# Patient Record
Sex: Female | Born: 1970 | Race: White | Hispanic: No | Marital: Married | Smoking: Former smoker
Health system: Southern US, Community
[De-identification: ages and names within clinical notes are randomized; demographics above are authoritative.]

## PROBLEM LIST (undated history)

## (undated) DIAGNOSIS — H04123 Dry eye syndrome of bilateral lacrimal glands: Secondary | ICD-10-CM

## (undated) DIAGNOSIS — M419 Scoliosis, unspecified: Secondary | ICD-10-CM

## (undated) DIAGNOSIS — L309 Dermatitis, unspecified: Secondary | ICD-10-CM

## (undated) DIAGNOSIS — A630 Anogenital (venereal) warts: Secondary | ICD-10-CM

## (undated) DIAGNOSIS — I7300 Raynaud's syndrome without gangrene: Secondary | ICD-10-CM

## (undated) DIAGNOSIS — B009 Herpesviral infection, unspecified: Secondary | ICD-10-CM

## (undated) DIAGNOSIS — R011 Cardiac murmur, unspecified: Secondary | ICD-10-CM

## (undated) HISTORY — DX: Dry eye syndrome of bilateral lacrimal glands: H04.123

## (undated) HISTORY — DX: Herpesviral infection, unspecified: B00.9

## (undated) HISTORY — DX: Raynaud's syndrome without gangrene: I73.00

## (undated) HISTORY — DX: Cardiac murmur, unspecified: R01.1

## (undated) HISTORY — DX: Dermatitis, unspecified: L30.9

## (undated) HISTORY — PX: WISDOM TOOTH EXTRACTION: SHX21

## (undated) HISTORY — DX: Anogenital (venereal) warts: A63.0

## (undated) HISTORY — PX: TONSILLECTOMY: SUR1361

## (undated) HISTORY — DX: Scoliosis, unspecified: M41.9

---

## 2015-10-11 LAB — LIPID PANEL
Cholesterol: 183 (ref 0–200)
HDL: 67 (ref 35–70)
LDL Cholesterol: 104
Triglycerides: 61 (ref 40–160)

## 2015-10-11 LAB — BASIC METABOLIC PANEL: GLUCOSE: 92

## 2017-12-03 LAB — HM MAMMOGRAPHY

## 2018-09-07 ENCOUNTER — Ambulatory Visit (INDEPENDENT_AMBULATORY_CARE_PROVIDER_SITE_OTHER): Payer: 59 | Admitting: Family Medicine

## 2018-09-07 ENCOUNTER — Encounter: Payer: Self-pay | Admitting: Family Medicine

## 2018-09-07 VITALS — BP 100/62 | HR 74 | Temp 99.0°F | Ht 64.75 in | Wt 135.2 lb

## 2018-09-07 DIAGNOSIS — Z3009 Encounter for other general counseling and advice on contraception: Secondary | ICD-10-CM

## 2018-09-07 DIAGNOSIS — J309 Allergic rhinitis, unspecified: Secondary | ICD-10-CM | POA: Insufficient documentation

## 2018-09-07 DIAGNOSIS — Z309 Encounter for contraceptive management, unspecified: Secondary | ICD-10-CM | POA: Insufficient documentation

## 2018-09-07 DIAGNOSIS — M419 Scoliosis, unspecified: Secondary | ICD-10-CM | POA: Diagnosis not present

## 2018-09-07 DIAGNOSIS — N649 Disorder of breast, unspecified: Secondary | ICD-10-CM

## 2018-09-07 DIAGNOSIS — H04129 Dry eye syndrome of unspecified lacrimal gland: Secondary | ICD-10-CM | POA: Insufficient documentation

## 2018-09-07 DIAGNOSIS — H04123 Dry eye syndrome of bilateral lacrimal glands: Secondary | ICD-10-CM

## 2018-09-07 MED ORDER — LOW-OGESTREL 0.3-30 MG-MCG PO TABS: ORAL_TABLET | ORAL | 11 refills | Status: DC

## 2018-09-07 NOTE — Assessment & Plan Note (Signed)
Stable.  Continue over-the-counter Allegra.

## 2018-09-07 NOTE — Progress Notes (Signed)
Chief Complaint:  Sarah Murphy is a 48 y.o. female who presents today with a chief complaint of nipple abnormality and to establish care.   Assessment/Plan:  Nipple abnormality Reassuring exam.  She has a few prominent hair follicles without any signs of underlying malignancy.  She is due for her mammogram-handout was given with instruction on how to get this set up.  Encounter for other general counseling and advice on contraception Will refill her OCPs today.  Discussed potential increased risk with advancing age and OCPs including, but not limited to dyslipidemia, vascular disease, VTE, and certain forms of cancer.  Chronically dry eyes Stable.  Continue management per ophthalmology.  Scoliosis Stable.  Continue home exercise program.  Allergic rhinitis Stable.  Continue over-the-counter Allegra.  Preventative Healthcare Patient was instructed to return soon for CPE. Health Maintenance Due  Topic Date Due  . HIV Screening  07/04/1985  . PAP SMEAR-Modifier  07/05/1991  . INFLUENZA VACCINE  03/11/2018      Subjective:  HPI:  Nipple abnormality Her symptoms brought her husband yesterday.  Located on the left inner elbow.  Has a few fine bumps to the area.  No pain.  No previous issues.  Has had recent mammogram which was negative.  No treatments tried.  No obvious precipitating events.  She has several other chronic, stable medical conditions outlined below:  # Allergic Rhinitis - Currently on Allegra daily and tolerates well.  # On Contraception -Currently on OCP once daily and tolerating well.  % Scoliosis -Currently managing without medications via chiropractor and home exercises.  % Chronic Dry Eyes -Follows with ophthalmology. -Currently on Restasis drops.  ROS: Per HPI, otherwise a complete review of systems was negative.   PMH:  The following were reviewed and entered/updated in epic: Past Medical History:  Diagnosis Date  . Genital warts   . Heart  murmur   . Scoliosis    Patient Active Problem List   Diagnosis Date Noted  . Scoliosis 09/07/2018  . Allergic rhinitis 09/07/2018  . Encounter for other general counseling and advice on contraception 09/07/2018  . Chronically dry eyes 09/07/2018   Past Surgical History:  Procedure Laterality Date  . TONSILLECTOMY      Family History  Problem Relation Age of Onset  . Asthma Mother   . Arthritis Mother   . Celiac disease Mother   . Arthritis Father   . Heart disease Father   . Atrial fibrillation Father   . Arthritis Sister   . Learning disabilities Sister   . Celiac disease Sister   . Asthma Maternal Grandmother   . Miscarriages / Stillbirths Maternal Grandmother   . Stroke Maternal Grandmother   . Colon cancer Paternal Grandmother   . Cancer Paternal Grandfather   . Leukemia Paternal Aunt   . Breast cancer Neg Hx     Medications- reviewed and updated Current Outpatient Medications  Medication Sig Dispense Refill  . BIOTIN PO Take by mouth.    . LOW-OGESTREL 0.3-30 MG-MCG tablet Take 1 tablet by mouth daily. 1 Package 11  . MAGNESIUM PO Take by mouth daily.     No current facility-administered medications for this visit.     Allergies-reviewed and updated No Known Allergies  Social History   Socioeconomic History  . Marital status: Married    Spouse name: Not on file  . Number of children: Not on file  . Years of education: Not on file  . Highest education level: Not on file  Occupational  History  . Not on file  Social Needs  . Financial resource strain: Not on file  . Food insecurity:    Worry: Not on file    Inability: Not on file  . Transportation needs:    Medical: Not on file    Non-medical: Not on file  Tobacco Use  . Smoking status: Former Smoker    Last attempt to quit: 02/05/2004    Years since quitting: 14.5  . Smokeless tobacco: Never Used  Substance and Sexual Activity  . Alcohol use: Yes  . Drug use: Never  . Sexual activity: Yes    Lifestyle  . Physical activity:    Days per week: Not on file    Minutes per session: Not on file  . Stress: Not on file  Relationships  . Social connections:    Talks on phone: Not on file    Gets together: Not on file    Attends religious service: Not on file    Active member of club or organization: Not on file    Attends meetings of clubs or organizations: Not on file    Relationship status: Not on file  Other Topics Concern  . Not on file  Social History Narrative  . Not on file         Objective:  Physical Exam: BP 100/62 (BP Location: Left Arm, Patient Position: Sitting, Cuff Size: Normal)   Pulse 74   Temp 99 F (37.2 C) (Oral)   Ht 5' 4.75" (1.645 m)   Wt 135 lb 3.2 oz (61.3 kg)   LMP 08/24/2018   SpO2 99%   BMI 22.67 kg/m   Gen: NAD, resting comfortably CV: Regular rate and rhythm with no murmurs appreciated Pulm: Normal work of breathing, clear to auscultation bilaterally with no crackles, wheezes, or rhonchi Breast: Left nipple with a few prominent hair follicles, no masses, lesions, or discharge.  Chaperone present for exam. GI: Normal bowel sounds present. Soft, Nontender, Nondistended. MSK: No edema, cyanosis, or clubbing noted Skin: Warm, dry Neuro: Grossly normal, moves all extremities Psych: Normal affect and thought content     Taunia Frasco M. Jerline Pain, MD 09/07/2018 12:20 PM

## 2018-09-07 NOTE — Assessment & Plan Note (Signed)
Stable.  Continue management per ophthalmology. 

## 2018-09-07 NOTE — Patient Instructions (Signed)
It was very nice to see you today!  I will refill your birth control medication.  The spot on your breast is nothing to worry about.  I will see you back in a few months for your physical. We will also update your mammogram.  Come back to see me sooner if needed.   Take care, Dr Jerline Pain

## 2018-09-07 NOTE — Assessment & Plan Note (Signed)
Stable.  Continue home exercise program.

## 2018-09-07 NOTE — Assessment & Plan Note (Signed)
Will refill her OCPs today.  Discussed potential increased risk with advancing age and OCPs including, but not limited to dyslipidemia, vascular disease, VTE, and certain forms of cancer.

## 2018-09-16 ENCOUNTER — Encounter: Payer: Self-pay | Admitting: Family Medicine

## 2018-09-27 ENCOUNTER — Other Ambulatory Visit: Payer: Self-pay

## 2018-09-27 ENCOUNTER — Telehealth: Payer: Self-pay | Admitting: Family Medicine

## 2018-09-27 MED ORDER — LOW-OGESTREL 0.3-30 MG-MCG PO TABS: ORAL_TABLET | ORAL | 3 refills | Status: DC

## 2018-09-27 NOTE — Telephone Encounter (Signed)
Copied from [place] [id number]. Topic: Quick Communication - Rx Refill/Question >> Sep 27, 2018  8:12 AM Margot Ables wrote: Medication: LOW-OGESTREL 0.3-30 MG-MCG tablet - mail order pharmacy noted this is a 28 day supply and needs to be changed to 90 day supply for them to fill RX  Has the patient contacted their pharmacy? yes Preferred Pharmacy (with phone number or street name): [place], [place] [phone] (Phone) [phone] (Fax)

## 2018-09-27 NOTE — Telephone Encounter (Signed)
See note

## 2018-09-27 NOTE — Telephone Encounter (Signed)
Rx sent to pharmacy   

## 2018-11-02 ENCOUNTER — Encounter: Payer: Self-pay | Admitting: Family Medicine

## 2018-11-02 ENCOUNTER — Ambulatory Visit (INDEPENDENT_AMBULATORY_CARE_PROVIDER_SITE_OTHER): Payer: 59 | Admitting: Family Medicine

## 2018-11-02 ENCOUNTER — Other Ambulatory Visit: Payer: Self-pay

## 2018-11-02 VITALS — BP 118/64 | HR 73 | Temp 98.4°F | Ht 65.0 in | Wt 138.4 lb

## 2018-11-02 DIAGNOSIS — Z131 Encounter for screening for diabetes mellitus: Secondary | ICD-10-CM

## 2018-11-02 DIAGNOSIS — Z13220 Encounter for screening for lipoid disorders: Secondary | ICD-10-CM

## 2018-11-02 DIAGNOSIS — H04123 Dry eye syndrome of bilateral lacrimal glands: Secondary | ICD-10-CM

## 2018-11-02 DIAGNOSIS — J309 Allergic rhinitis, unspecified: Secondary | ICD-10-CM | POA: Diagnosis not present

## 2018-11-02 DIAGNOSIS — Z0001 Encounter for general adult medical examination with abnormal findings: Secondary | ICD-10-CM

## 2018-11-02 LAB — LIPID PANEL
Cholesterol: 169 mg/dL (ref 0–200)
HDL: 55.7 mg/dL (ref >39.00)
LDL Cholesterol: 96 mg/dL (ref 0–99)
NonHDL: 113.54
Total CHOL/HDL Ratio: 3
Triglycerides: 86 mg/dL (ref 0.0–149.0)
VLDL: 17.2 mg/dL (ref 0.0–40.0)

## 2018-11-02 LAB — GLUCOSE, RANDOM: Glucose, Bld: 89 mg/dL (ref 70–99)

## 2018-11-02 NOTE — Progress Notes (Signed)
Dr Marigene Ehlers interpretation of your lab work:  [facility]! Your cholesterol levels and blood sugar levels are perfect. Keep up the good work and we can recheck in a year or so.    If you have any additional questions, please give [place] a call or send [place] a message through [place].  Take care, Dr Jerline Pain

## 2018-11-02 NOTE — Assessment & Plan Note (Signed)
Stable.  Continue over-the-counter Allegra.

## 2018-11-02 NOTE — Patient Instructions (Signed)
It was very nice to see you today!  Keep up the good work!  We will check labs today.   Eat at least 3 REAL meals and 1-2 snacks per day.  Aim for no more than 5 hours between eating.  Eat breakfast within one hour of getting up.    Obtain twice as many fruits/vegetables as protein or carbohydrate foods for both lunch and dinner.   Cut down on sweet beverages. This includes juice, soda, and sweet tea.    Exercise at least 150 minutes every week.   Come back in 1 year for your next physical with blood work, or sooner as needed.  Take care, Dr Jerline Pain   Preventive Care 40-64 Years, Female Preventive care refers to lifestyle choices and visits with your health care provider that can promote health and wellness. What does preventive care include?   A yearly physical exam. This is also called an annual well check.  Dental exams once or twice a year.  Routine eye exams. Ask your health care provider how often you should have your eyes checked.  Personal lifestyle choices, including: ? Daily care of your teeth and gums. ? Regular physical activity. ? Eating a healthy diet. ? Avoiding tobacco and drug use. ? Limiting alcohol use. ? Practicing safe sex. ? Taking low-dose aspirin daily starting at age 19. ? Taking vitamin and mineral supplements as recommended by your health care provider. What happens during an annual well check? The services and screenings done by your health care provider during your annual well check will depend on your age, overall health, lifestyle risk factors, and family history of disease. Counseling Your health care provider may ask you questions about your:  Alcohol use.  Tobacco use.  Drug use.  Emotional well-being.  Home and relationship well-being.  Sexual activity.  Eating habits.  Work and work Statistician.  Method of birth control.  Menstrual cycle.  Pregnancy history. Screening You may have the following tests or  measurements:  Height, weight, and BMI.  Blood pressure.  Lipid and cholesterol levels. These may be checked every 5 years, or more frequently if you are over 69 years old.  Skin check.  Lung cancer screening. You may have this screening every year starting at age 33 if you have a 30-pack-year history of smoking and currently smoke or have quit within the past 15 years.  Colorectal cancer screening. All adults should have this screening starting at age 32 and continuing until age 7. Your health care provider may recommend screening at age 75. You will have tests every 1-10 years, depending on your results and the type of screening test. People at increased risk should start screening at an earlier age. Screening tests may include: ? Guaiac-based fecal occult blood testing. ? Fecal immunochemical test (FIT). ? Stool DNA test. ? Virtual colonoscopy. ? Sigmoidoscopy. During this test, a flexible tube with a tiny camera (sigmoidoscope) is used to examine your rectum and lower colon. The sigmoidoscope is inserted through your anus into your rectum and lower colon. ? Colonoscopy. During this test, a long, thin, flexible tube with a tiny camera (colonoscope) is used to examine your entire colon and rectum.  Hepatitis C blood test.  Hepatitis B blood test.  Sexually transmitted disease (STD) testing.  Diabetes screening. This is done by checking your blood sugar (glucose) after you have not eaten for a while (fasting). You may have this done every 1-3 years.  Mammogram. This may be done every 1-2  years. Talk to your health care provider about when you should start having regular mammograms. This may depend on whether you have a family history of breast cancer.  BRCA-related cancer screening. This may be done if you have a family history of breast, ovarian, tubal, or peritoneal cancers.  Pelvic exam and Pap test. This may be done every 3 years starting at age 16. Starting at age 59, this may  be done every 5 years if you have a Pap test in combination with an HPV test.  Bone density scan. This is done to screen for osteoporosis. You may have this scan if you are at high risk for osteoporosis. Discuss your test results, treatment options, and if necessary, the need for more tests with your health care provider. Vaccines Your health care provider may recommend certain vaccines, such as:  Influenza vaccine. This is recommended every year.  Tetanus, diphtheria, and acellular pertussis (Tdap, Td) vaccine. You may need a Td booster every 10 years.  Varicella vaccine. You may need this if you have not been vaccinated.  Zoster vaccine. You may need this after age 27.  Measles, mumps, and rubella (MMR) vaccine. You may need at least one dose of MMR if you were born in 1957 or later. You may also need a second dose.  Pneumococcal 13-valent conjugate (PCV13) vaccine. You may need this if you have certain conditions and were not previously vaccinated.  Pneumococcal polysaccharide (PPSV23) vaccine. You may need one or two doses if you smoke cigarettes or if you have certain conditions.  Meningococcal vaccine. You may need this if you have certain conditions.  Hepatitis A vaccine. You may need this if you have certain conditions or if you travel or work in places where you may be exposed to hepatitis A.  Hepatitis B vaccine. You may need this if you have certain conditions or if you travel or work in places where you may be exposed to hepatitis B.  Haemophilus influenzae type b (Hib) vaccine. You may need this if you have certain conditions. Talk to your health care provider about which screenings and vaccines you need and how often you need them. This information is not intended to replace advice given to you by your health care provider. Make sure you discuss any questions you have with your health care provider. Document Released: 08/24/2015 Document Revised: 09/17/2017 Document  Reviewed: 05/29/2015 Elsevier Interactive Patient Education  2019 [employer].

## 2018-11-02 NOTE — Assessment & Plan Note (Signed)
Stable.  Continue Restasis drops per ophthalmology.

## 2018-11-02 NOTE — Progress Notes (Signed)
Chief Complaint:  Sarah Murphy is a 48 y.o. female who presents today for her annual comprehensive physical exam.    Assessment/Plan:  Chronically dry eyes Stable.  Continue Restasis drops per ophthalmology.  Allergic rhinitis Stable.  Continue over-the-counter Allegra.  Preventative Healthcare: Check fasting lipid panel and glucose.  Otherwise up-to-date on screenings and immunizations.  Patient Counseling(The following topics were reviewed and/or handout was given):  -Nutrition: Stressed importance of moderation in sodium/caffeine intake, saturated fat and cholesterol, caloric balance, sufficient intake of fresh fruits, vegetables, and fiber.  -Stressed the importance of regular exercise.   -Substance Abuse: Discussed cessation/primary prevention of tobacco, alcohol, or other drug use; driving or other dangerous activities under the influence; availability of treatment for abuse.   -Injury prevention: Discussed safety belts, safety helmets, smoke detector, smoking near bedding or upholstery.   -Sexuality: Discussed sexually transmitted diseases, partner selection, use of condoms, avoidance of unintended pregnancy and contraceptive alternatives.   -Dental health: Discussed importance of regular tooth brushing, flossing, and dental visits.  -Health maintenance and immunizations reviewed. Please refer to Health maintenance section.  Return to care in 1 year for next preventative visit.     Subjective:  HPI:  She has no acute complaints today.   Her stable, chronic medical conditions are outlined below:  # Allergic Rhinitis - Currently on Allegra daily and tolerates well.  # On Contraception -Currently on OCP once daily and tolerating well.  % Scoliosis -Currently managing without medications via chiropractor and home exercises.  % Chronic Dry Eyes -Follows with ophthalmology. -Currently on Restasis drops.  Lifestyle Diet: No specific diets or eating plans.  Exercise:  Not much currently. Likes to walk regularly.   Depression screen PHQ 2/9 11/02/2018  Decreased Interest 0  Down, Depressed, Hopeless 0  PHQ - 2 Score 0   Health Maintenance Due  Topic Date Due  . HIV Screening  07/04/1985    ROS: Per HPI, otherwise a complete review of systems was negative.   PMH:  The following were reviewed and entered/updated in epic: Past Medical History:  Diagnosis Date  . Genital warts   . Heart murmur   . Scoliosis    Patient Active Problem List   Diagnosis Date Noted  . Scoliosis 09/07/2018  . Allergic rhinitis 09/07/2018  . Encounter for other general counseling and advice on contraception 09/07/2018  . Chronically dry eyes 09/07/2018   Past Surgical History:  Procedure Laterality Date  . TONSILLECTOMY      Family History  Problem Relation Age of Onset  . Asthma Mother   . Arthritis Mother   . Celiac disease Mother   . Arthritis Father   . Heart disease Father   . Atrial fibrillation Father   . Arthritis Sister   . Learning disabilities Sister   . Celiac disease Sister   . Asthma Maternal Grandmother   . Miscarriages / Stillbirths Maternal Grandmother   . Stroke Maternal Grandmother   . Colon cancer Paternal Grandmother   . Cancer Paternal Grandfather   . Leukemia Paternal Aunt   . Breast cancer Neg Hx     Medications- reviewed and updated Current Outpatient Medications  Medication Sig Dispense Refill  . BIOTIN PO Take by mouth.    . LOW-OGESTREL 0.3-30 MG-MCG tablet Take 1 tablet by mouth daily. 3 Package 3  . MAGNESIUM PO Take by mouth daily.     No current facility-administered medications for this visit.     Allergies-reviewed and updated No Known Allergies  Social History   Socioeconomic History  . Marital status: Married    Spouse name: Not on file  . Number of children: Not on file  . Years of education: Not on file  . Highest education level: Not on file  Occupational History  . Not on file  Social Needs  .  Financial resource strain: Not on file  . Food insecurity:    Worry: Not on file    Inability: Not on file  . Transportation needs:    Medical: Not on file    Non-medical: Not on file  Tobacco Use  . Smoking status: Former Smoker    Last attempt to quit: 02/05/2004    Years since quitting: 14.7  . Smokeless tobacco: Never Used  Substance and Sexual Activity  . Alcohol use: Yes  . Drug use: Never  . Sexual activity: Yes  Lifestyle  . Physical activity:    Days per week: Not on file    Minutes per session: Not on file  . Stress: Not on file  Relationships  . Social connections:    Talks on phone: Not on file    Gets together: Not on file    Attends religious service: Not on file    Active member of club or organization: Not on file    Attends meetings of clubs or organizations: Not on file    Relationship status: Not on file  Other Topics Concern  . Not on file  Social History Narrative  . Not on file        Objective:  Physical Exam: BP 118/64 (BP Location: Left Arm, Patient Position: Sitting, Cuff Size: Normal)   Pulse 73   Temp 98.4 F (36.9 C) (Oral)   Ht 5\' 5"  (1.651 m)   Wt 138 lb 6.4 oz (62.8 kg)   SpO2 99%   BMI 23.03 kg/m   Body mass index is 23.03 kg/m. Wt Readings from Last 3 Encounters:  11/02/18 138 lb 6.4 oz (62.8 kg)  09/07/18 135 lb 3.2 oz (61.3 kg)   Gen: NAD, resting comfortably HEENT: TMs normal bilaterally. OP clear. No thyromegaly noted.  CV: RRR with no murmurs appreciated Pulm: NWOB, CTAB with no crackles, wheezes, or rhonchi GI: Normal bowel sounds present. Soft, Nontender, Nondistended. MSK: no edema, cyanosis, or clubbing noted Skin: warm, dry Neuro: CN2-12 grossly intact. Strength 5/5 in upper and lower extremities. Reflexes symmetric and intact bilaterally.  Psych: Normal affect and thought content     Caleb M. Jerline Pain, MD 11/02/2018 8:31 AM

## 2018-11-25 ENCOUNTER — Other Ambulatory Visit: Payer: Self-pay

## 2018-11-25 ENCOUNTER — Telehealth: Payer: Self-pay | Admitting: Family Medicine

## 2018-11-25 MED ORDER — CLINDAMYCIN PHOS-BENZOYL PEROX 1.2-3.75 % EX GEL: 1.0000 "application " | Freq: Every day | CUTANEOUS | 3 refills | Status: DC

## 2018-11-25 NOTE — Telephone Encounter (Signed)
Copied from [place] [id number]. Topic: Quick Communication - Rx Refill/Question >> Nov 25, 2018  8:20 AM Leward Quan A wrote: Medication: [place] (cindamycin phosphate and benzoyl peroxide gel, 1.2%/3.75)  Has the patient contacted their pharmacy? No. (Agent: If no, request that the patient contact the pharmacy for the refill.) (Agent: If yes, when and what did the pharmacy advise?)  Preferred Pharmacy (with phone number or street name): [id number] [place], [place] [phone] (Phone) [phone] (Fax)    Agent: Please be advised that RX refills may take up to 3 business days. We ask that you follow-up with your pharmacy.

## 2018-11-25 NOTE — Telephone Encounter (Signed)
See note

## 2018-11-25 NOTE — Telephone Encounter (Signed)
Rx sent, patient notified.  

## 2018-11-25 NOTE — Telephone Encounter (Signed)
Ok with me. Please place any necessary orders. 

## 2018-12-01 ENCOUNTER — Other Ambulatory Visit: Payer: Self-pay | Admitting: Family Medicine

## 2018-12-03 ENCOUNTER — Other Ambulatory Visit: Payer: Self-pay | Admitting: Family Medicine

## 2019-04-26 ENCOUNTER — Other Ambulatory Visit: Payer: Self-pay | Admitting: Family Medicine

## 2019-05-25 ENCOUNTER — Other Ambulatory Visit: Payer: Self-pay | Admitting: Family Medicine

## 2019-05-25 DIAGNOSIS — Z1231 Encounter for screening mammogram for malignant neoplasm of breast: Secondary | ICD-10-CM

## 2019-07-15 ENCOUNTER — Ambulatory Visit: Payer: 59

## 2019-07-27 ENCOUNTER — Other Ambulatory Visit: Payer: Self-pay

## 2019-07-27 MED ORDER — LOW-OGESTREL 0.3-30 MG-MCG PO TABS: ORAL_TABLET | ORAL | 0 refills | Status: DC

## 2019-07-29 ENCOUNTER — Other Ambulatory Visit: Payer: Self-pay

## 2019-07-29 ENCOUNTER — Other Ambulatory Visit: Payer: Self-pay | Admitting: Family Medicine

## 2019-07-29 MED ORDER — LOW-OGESTREL 0.3-30 MG-MCG PO TABS: ORAL_TABLET | ORAL | 0 refills | Status: DC

## 2019-07-29 NOTE — Telephone Encounter (Signed)
   Notes to clinic: Pt stated [facility] [address] and resent to [facility] 90 day supply due to insurance     Requested Prescriptions  Pending Prescriptions Disp Refills   LOW-OGESTREL 0.3-30 MG-MCG tablet 3 Package 0    Sig: Take 1 tablet by mouth daily.      OB/GYN:  Contraceptives Passed - 07/29/2019  8:42 AM      Passed - Last BP in normal range    BP Readings from Last 1 Encounters:  11/02/18 118/64          Passed - Valid encounter within last 12 months    Recent Outpatient Visits           8 months ago Encounter for general adult medical examination with abnormal findings   [facility], Algis Greenhouse, MD   10 months ago Scoliosis, unspecified scoliosis type, unspecified spinal region   [facility], Algis Greenhouse, MD

## 2019-07-29 NOTE — Telephone Encounter (Signed)
See note

## 2019-07-29 NOTE — Telephone Encounter (Signed)
Medication Refill - Medication: LOW-OGESTREL 0.3-30 MG-MCG tablet / Pt [facility] [address] and resent to [facility] 90 day supply due to insurance  [phone]-Physician line to refill   Has the patient contacted their pharmacy? No. (Agent: If no, request that the patient contact the pharmacy for the refill.) (Agent: If yes, when and what did the pharmacy advise?)  Preferred Pharmacy (with phone number or street name): [facility] [place], [place] Phone:  [phone]  Fax:  [phone]      Agent: Please be advised that RX refills may take up to 3 business days. We ask that you follow-up with your pharmacy.

## 2019-08-29 ENCOUNTER — Other Ambulatory Visit: Payer: Self-pay

## 2019-08-29 ENCOUNTER — Telehealth: Payer: Self-pay | Admitting: Family Medicine

## 2019-08-29 MED ORDER — LOW-OGESTREL 0.3-30 MG-MCG PO TABS: ORAL_TABLET | ORAL | 0 refills | Status: DC

## 2019-08-29 NOTE — Telephone Encounter (Signed)
MEDICATION: Low-Ogestrel 0.3-30 MG-MCG   [id number]  **Let patient know to contact pharmacy at the end of the day to make sure medication is ready. **  ** Please notify patient to allow 48-72 hours to process**  **Encourage patient to contact the pharmacy for refills or they can request refills through [facility]**

## 2019-08-29 NOTE — Telephone Encounter (Signed)
Rx sent 

## 2019-09-09 ENCOUNTER — Ambulatory Visit (INDEPENDENT_AMBULATORY_CARE_PROVIDER_SITE_OTHER): Payer: 59 | Admitting: Family Medicine

## 2019-09-09 DIAGNOSIS — L709 Acne, unspecified: Secondary | ICD-10-CM | POA: Diagnosis not present

## 2019-09-09 DIAGNOSIS — Z3009 Encounter for other general counseling and advice on contraception: Secondary | ICD-10-CM

## 2019-09-09 MED ORDER — LOW-OGESTREL 0.3-30 MG-MCG PO TABS: ORAL_TABLET | ORAL | 3 refills | Status: DC

## 2019-09-09 MED ORDER — CLINDAMYCIN PHOS-BENZOYL PEROX 1-5 % EX GEL: CUTANEOUS | 0 refills | Status: DC

## 2019-09-09 NOTE — Progress Notes (Signed)
   Sarah Murphy is a 49 y.o. female who presents today for a virtual office visit.  Assessment/Plan:  Chronic Problems Addressed Today: Acne Stable. benzaclin refilled.   Encounter for other general counseling and advice on contraception Refilled OCP.      Subjective:  HPI:  See A/P.        Objective/Observations  Physical Exam: Gen: NAD, resting comfortably Pulm: Normal work of breathing Neuro: Grossly normal, moves all extremities Psych: Normal affect and thought content  Virtual Visit via Video   I connected with Sarah Murphy on 09/09/19 at 11:00 AM EST by a video enabled telemedicine application and verified that I am speaking with the correct person using two identifiers. The limitations of evaluation and management by telemedicine and the availability of in person appointments were discussed. The patient expressed understanding and agreed to proceed.   Patient location: Home Provider location: [place] participating in the virtual visit: Myself and Patient     Algis Greenhouse. Jerline Pain, MD 09/09/2019 12:33 PM

## 2019-09-09 NOTE — Assessment & Plan Note (Signed)
Stable. benzaclin refilled.

## 2019-09-09 NOTE — Assessment & Plan Note (Signed)
Refilled OCP

## 2019-10-13 ENCOUNTER — Ambulatory Visit
Admission: RE | Admit: 2019-10-13 | Discharge: 2019-10-13 | Disposition: A | Discharge status: Home / Self Care | Payer: 59 | Source: Ambulatory Visit | Attending: Family Medicine | Admitting: Family Medicine

## 2019-10-13 ENCOUNTER — Other Ambulatory Visit: Payer: Self-pay

## 2019-10-13 DIAGNOSIS — Z1231 Encounter for screening mammogram for malignant neoplasm of breast: Secondary | ICD-10-CM

## 2019-10-21 ENCOUNTER — Ambulatory Visit: Payer: 59 | Attending: Internal Medicine

## 2019-10-21 DIAGNOSIS — Z23 Encounter for immunization: Secondary | ICD-10-CM

## 2019-10-21 NOTE — Progress Notes (Signed)
   Covid-19 Vaccination Clinic  Name:  Sarah Murphy    MRN: [id number] DOB: 11/23/1970  10/21/2019  Sarah Murphy was observed post Covid-19 immunization for 15 minutes without incident. She was provided with Vaccine Information Sheet and instruction to access the V-Safe system.   Sarah Murphy was instructed to call 911 with any severe reactions post vaccine: Marland Kitchen Difficulty breathing  . Swelling of face and throat  . A fast heartbeat  . A bad rash all over body  . Dizziness and weakness   Immunizations Administered    Name Date Dose VIS Date Route   Pfizer COVID-19 Vaccine 10/21/2019  3:41 PM 0.3 mL 07/22/2019 Intramuscular   Manufacturer: [place]   Lot: [id number]   [place]: [id number]

## 2019-11-14 ENCOUNTER — Ambulatory Visit: Payer: 59 | Attending: Internal Medicine

## 2019-11-14 DIAGNOSIS — Z23 Encounter for immunization: Secondary | ICD-10-CM

## 2019-11-14 NOTE — Progress Notes (Signed)
   Covid-19 Vaccination Clinic  Name:  Sarah Murphy    MRN: [id number] DOB: [birth year]  11/14/2019  Ms. Sarah Murphy was observed post Covid-19 immunization for 15 minutes without incident. She was provided with Vaccine Information Sheet and instruction to access the V-Safe system.   Ms. Sarah Murphy was instructed to call 911 with any severe reactions post vaccine: Marland Kitchen Difficulty breathing  . Swelling of face and throat  . A fast heartbeat  . A bad rash all over body  . Dizziness and weakness   Immunizations Administered    Name Date Dose VIS Date Route   Pfizer COVID-19 Vaccine 11/14/2019  5:10 PM 0.3 mL 07/22/2019 Intramuscular   Manufacturer: [employer], [employer]   Lot: [id number]   [place]: [id number]

## 2020-02-15 ENCOUNTER — Telehealth: Payer: Self-pay | Admitting: *Deleted

## 2020-02-15 NOTE — Telephone Encounter (Signed)
PA #[id number] Rx Clindamycin-benzoyl perox 1-5% has been approved through 02/07/2021

## 2020-06-08 ENCOUNTER — Other Ambulatory Visit: Payer: Self-pay | Admitting: *Deleted

## 2020-06-08 ENCOUNTER — Encounter: Payer: Self-pay | Admitting: Family Medicine

## 2020-06-08 ENCOUNTER — Other Ambulatory Visit: Payer: Self-pay

## 2020-06-08 ENCOUNTER — Ambulatory Visit (INDEPENDENT_AMBULATORY_CARE_PROVIDER_SITE_OTHER): Payer: 59 | Admitting: Family Medicine

## 2020-06-08 VITALS — BP 106/69 | HR 74 | Temp 98.5°F | Ht 65.0 in | Wt 141.2 lb

## 2020-06-08 DIAGNOSIS — H04123 Dry eye syndrome of bilateral lacrimal glands: Secondary | ICD-10-CM

## 2020-06-08 DIAGNOSIS — L709 Acne, unspecified: Secondary | ICD-10-CM

## 2020-06-08 DIAGNOSIS — R252 Cramp and spasm: Secondary | ICD-10-CM

## 2020-06-08 DIAGNOSIS — R5383 Other fatigue: Secondary | ICD-10-CM

## 2020-06-08 DIAGNOSIS — Z1211 Encounter for screening for malignant neoplasm of colon: Secondary | ICD-10-CM

## 2020-06-08 DIAGNOSIS — L659 Nonscarring hair loss, unspecified: Secondary | ICD-10-CM

## 2020-06-08 NOTE — Progress Notes (Signed)
   Sarah Murphy is a 49 y.o. female who presents today for an office visit.  Assessment/Plan:  New/Acute Problems: Fatigue/Dry Eyes/ Hair Loss Check requested labs including TSH, CBC, CMET, iron panel, B12, and vitamin D.  If labs are negative would consider referral back to dermatology for hair loss.  Could be sequela of COVID-19 infection.  Also possibly could be perimenopausal symptoms.  Chronic Problems Addressed Today: Acne Stable. On Benzaclin and follows with dermatology.   Chronically dry eyes Stable.  On Restasis per ophthalmology.  Will check labs as above.  Will place referral for colonoscopy.  She will come back soon for shingles vaccine.    Subjective:  HPI:  Patient here with several issues that she would like to have blood work done for.  She is concerned about low thyroid.  She has had cramping in legs for several years.  Few weeks ago noticed ridges on her tongue.  She is also been having hair loss for the past 6 months or so.  She was diagnosed with Covid 6 months ago.  Additionally having dry eyes and fatigue.  Symptoms are manageable.  Sister has thyroid issues.  Has family history of celiac disease as well.  Sleeping well.       Objective:  Physical Exam: BP 106/69   Pulse 74   Temp 98.5 F (36.9 C) (Temporal)   Ht 5\' 5"  (1.651 m)   Wt 141 lb 3.2 oz (64 kg)   SpO2 100%   BMI 23.50 kg/m   Gen: No acute distress, resting comfortably CV: Regular rate and rhythm with no murmurs appreciated Pulm: Normal work of breathing, clear to auscultation bilaterally with no crackles, wheezes, or rhonchi Neuro: Grossly normal, moves all extremities Psych: Normal affect and thought content      Isak Sotomayor M. Jerline Pain, MD 06/08/2020 8:28 AM

## 2020-06-08 NOTE — Assessment & Plan Note (Signed)
Stable. On Benzaclin and follows with dermatology.

## 2020-06-08 NOTE — Patient Instructions (Signed)
It was very nice to see you today!  We will check blood work today.  I will also place referral for your colonoscopy.  Take care, Dr Jerline Pain  Please try these tips to maintain a healthy lifestyle:   Eat at least 3 REAL meals and 1-2 snacks per day.  Aim for no more than 5 hours between eating.  If you eat breakfast, please do so within one hour of getting up.    Each meal should contain half fruits/vegetables, one quarter protein, and one quarter carbs (no bigger than a computer mouse)   Cut down on sweet beverages. This includes juice, soda, and sweet tea.     Drink at least 1 glass of water with each meal and aim for at least 8 glasses per day   Exercise at least 150 minutes every week.

## 2020-06-08 NOTE — Assessment & Plan Note (Signed)
Stable.  On Restasis per ophthalmology.  Will check labs as above.

## 2020-06-09 LAB — CBC
HCT: 43.6 % (ref 35.0–45.0)
Hemoglobin: 13.8 g/dL (ref 11.7–15.5)
MCH: 28.7 pg (ref 27.0–33.0)
MCHC: 31.7 g/dL — ABNORMAL LOW (ref 32.0–36.0)
MCV: 90.6 fL (ref 80.0–100.0)
MPV: 10 fL (ref 7.5–12.5)
Platelets: 292 10*3/uL (ref 140–400)
RBC: 4.81 10*6/uL (ref 3.80–5.10)
RDW: 13.1 % (ref 11.0–15.0)
WBC: 7.1 10*3/uL (ref 3.8–10.8)

## 2020-06-09 LAB — COMPREHENSIVE METABOLIC PANEL
AG Ratio: 1.6 (calc) (ref 1.0–2.5)
ALT: 14 U/L (ref 6–29)
AST: 14 U/L (ref 10–35)
Albumin: 4.5 g/dL (ref 3.6–5.1)
Alkaline phosphatase (APISO): 40 U/L (ref 31–125)
BUN: 14 mg/dL (ref 7–25)
CO2: 27 mmol/L (ref 20–32)
Calcium: 9.6 mg/dL (ref 8.6–10.2)
Chloride: 103 mmol/L (ref 98–110)
Creat: 0.95 mg/dL (ref 0.50–1.10)
Globulin: 2.8 g/dL (calc) (ref 1.9–3.7)
Glucose, Bld: 115 mg/dL — ABNORMAL HIGH (ref 65–99)
Potassium: 4.9 mmol/L (ref 3.5–5.3)
Sodium: 138 mmol/L (ref 135–146)
Total Bilirubin: 0.8 mg/dL (ref 0.2–1.2)
Total Protein: 7.3 g/dL (ref 6.1–8.1)

## 2020-06-09 LAB — IRON, TOTAL/TOTAL IRON BINDING CAP
%SAT: 41 % (calc) (ref 16–45)
Iron: 152 ug/dL (ref 40–190)
TIBC: 373 mcg/dL (calc) (ref 250–450)

## 2020-06-09 LAB — FERRITIN: Ferritin: 73 ng/mL (ref 16–232)

## 2020-06-09 LAB — TSH: TSH: 1.44 mIU/L

## 2020-06-09 LAB — VITAMIN B12: Vitamin B-12: 281 pg/mL (ref 200–1100)

## 2020-06-09 LAB — VITAMIN D 25 HYDROXY (VIT D DEFICIENCY, FRACTURES): Vit D, 25-Hydroxy: 34 ng/mL (ref 30–100)

## 2020-06-11 NOTE — Progress Notes (Signed)
Please inform patient of the following:  Labs are all normal. No clear explanation for her hair loss. Recommend she follow back up with dermatology.

## 2020-06-23 ENCOUNTER — Ambulatory Visit: Payer: 59 | Attending: Internal Medicine

## 2020-06-23 DIAGNOSIS — Z23 Encounter for immunization: Secondary | ICD-10-CM

## 2020-06-23 NOTE — Progress Notes (Signed)
   Covid-19 Vaccination Clinic  Name:  Sarah Murphy    MRN: [id number] DOB: September 01, 1970  06/23/2020  [name] was observed post Covid-19 immunization for 15 minutes without incident. She was provided with Vaccine Information Sheet and instruction to access the V-Safe system.   [name] was instructed to call 911 with any severe reactions post vaccine: Marland Kitchen Difficulty breathing  . Swelling of face and throat  . A fast heartbeat  . A bad rash all over body  . Dizziness and weakness

## 2020-07-31 ENCOUNTER — Other Ambulatory Visit: Payer: Self-pay

## 2020-07-31 ENCOUNTER — Ambulatory Visit (INDEPENDENT_AMBULATORY_CARE_PROVIDER_SITE_OTHER): Payer: 59

## 2020-07-31 DIAGNOSIS — Z23 Encounter for immunization: Secondary | ICD-10-CM | POA: Diagnosis not present

## 2020-09-11 ENCOUNTER — Other Ambulatory Visit: Payer: Self-pay | Admitting: Family Medicine

## 2020-09-11 DIAGNOSIS — Z1231 Encounter for screening mammogram for malignant neoplasm of breast: Secondary | ICD-10-CM

## 2020-09-21 ENCOUNTER — Ambulatory Visit (AMBULATORY_SURGERY_CENTER): Payer: Self-pay

## 2020-09-21 ENCOUNTER — Other Ambulatory Visit: Payer: Self-pay

## 2020-09-21 VITALS — Ht 65.0 in | Wt 142.0 lb

## 2020-09-21 DIAGNOSIS — Z1211 Encounter for screening for malignant neoplasm of colon: Secondary | ICD-10-CM

## 2020-09-21 MED ORDER — NA SULFATE-K SULFATE-MG SULF 17.5-3.13-1.6 GM/177ML PO SOLN: 1.0000 | Freq: Once | ORAL | 0 refills | Status: AC

## 2020-09-21 NOTE — Progress Notes (Signed)
No egg or soy allergy known to patient  No issues with past sedation with any surgeries or procedures No intubation problems in the past  No FH of Malignant Hyperthermia No diet pills per patient No home 02 use per patient  No blood thinners per patient  Pt denies issues with constipation  No A fib or A flutter  EMMI video via MyChart  COVID 19 guidelines implemented in PV today with Pt and RN  Pt is fully vaccinated for Covid x 2 + booster= Pt denies loose or missing teeth; Patient denies dentures, partials, or bonded teeth; Patient reports dental implants, and a capped tooth; Coupon given to pt in PV today , Code to Pharmacy and  NO PA's for preps discussed with pt in PV today  Discussed with pt there will be an out-of-pocket cost for prep and that varies from $0 to 70 dollars  Due to the COVID-19 pandemic we are asking patients to follow certain guidelines.  Pt aware of COVID protocols and [facility] guidelines

## 2020-09-24 ENCOUNTER — Other Ambulatory Visit: Payer: Self-pay | Admitting: *Deleted

## 2020-09-24 MED ORDER — LOW-OGESTREL 0.3-30 MG-MCG PO TABS
ORAL_TABLET | ORAL | 0 refills | Status: DC
Start: 2020-09-24 — End: 2020-11-14

## 2020-10-03 ENCOUNTER — Encounter: Payer: Self-pay | Admitting: Gastroenterology

## 2020-10-05 ENCOUNTER — Encounter: Payer: Self-pay | Admitting: Gastroenterology

## 2020-10-05 ENCOUNTER — Ambulatory Visit (AMBULATORY_SURGERY_CENTER): Payer: PRIVATE HEALTH INSURANCE | Admitting: Gastroenterology

## 2020-10-05 ENCOUNTER — Other Ambulatory Visit: Payer: Self-pay

## 2020-10-05 VITALS — BP 144/76 | HR 81 | Temp 98.6°F | Resp 23 | Ht 65.0 in | Wt 142.0 lb

## 2020-10-05 DIAGNOSIS — D125 Benign neoplasm of sigmoid colon: Secondary | ICD-10-CM | POA: Diagnosis not present

## 2020-10-05 DIAGNOSIS — D120 Benign neoplasm of cecum: Secondary | ICD-10-CM | POA: Diagnosis not present

## 2020-10-05 DIAGNOSIS — Z800 Family history of malignant neoplasm of digestive organs: Secondary | ICD-10-CM | POA: Diagnosis not present

## 2020-10-05 DIAGNOSIS — Z1211 Encounter for screening for malignant neoplasm of colon: Secondary | ICD-10-CM

## 2020-10-05 DIAGNOSIS — Z8371 Family history of colonic polyps: Secondary | ICD-10-CM | POA: Diagnosis not present

## 2020-10-05 MED ORDER — SODIUM CHLORIDE 0.9 % IV SOLN: 500.0000 mL | INTRAVENOUS | Status: DC

## 2020-10-05 NOTE — Progress Notes (Signed)
Called to room to assist during endoscopic procedure.  Patient ID and intended procedure confirmed with present staff. Received instructions for my participation in the procedure from the performing physician.  

## 2020-10-05 NOTE — Patient Instructions (Signed)
YOU HAD AN ENDOSCOPIC PROCEDURE TODAY AT THE [facility]:   Refer to the procedure report that was given to you for any specific questions about what was found during the examination.  If the procedure report does not answer your questions, please call your gastroenterologist to clarify.  If you requested that your care partner not be given the details of your procedure findings, then the procedure report has been included in a sealed envelope for you to review at your convenience later.  YOU SHOULD EXPECT: Some feelings of bloating in the abdomen. Passage of more gas than usual.  Walking can help get rid of the air that was put into your GI tract during the procedure and reduce the bloating. If you had a lower endoscopy (such as a colonoscopy or flexible sigmoidoscopy) you may notice spotting of blood in your stool or on the toilet paper. If you underwent a bowel prep for your procedure, you may not have a normal bowel movement for a few days.  Please Note:  You might notice some irritation and congestion in your nose or some drainage.  This is from the oxygen used during your procedure.  There is no need for concern and it should clear up in a day or so.  SYMPTOMS TO REPORT IMMEDIATELY:   Following lower endoscopy (colonoscopy or flexible sigmoidoscopy):  Excessive amounts of blood in the stool  Significant tenderness or worsening of abdominal pains  Swelling of the abdomen that is new, acute  Fever of 100F or higher   Following upper endoscopy (EGD)  Vomiting of blood or coffee ground material  New chest pain or pain under the shoulder blades  Painful or persistently difficult swallowing  New shortness of breath  Fever of 100F or higher  Black, tarry-looking stools  For urgent or emergent issues, a gastroenterologist can be reached at any hour by calling [phone]. Do not use MyChart messaging for urgent concerns.    DIET:  We do recommend a small meal at first, but  then you may proceed to your regular diet.  Drink plenty of fluids but you should avoid alcoholic beverages for 24 hours.  ACTIVITY:  You should plan to take it easy for the rest of today and you should NOT DRIVE or use heavy machinery until tomorrow (because of the sedation medicines used during the test).    FOLLOW UP: Our staff will call the number listed on your records 48-72 hours following your procedure to check on you and address any questions or concerns that you may have regarding the information given to you following your procedure. If we do not reach you, we will leave a message.  We will attempt to reach you two times.  During this call, we will ask if you have developed any symptoms of COVID 19. If you develop any symptoms (ie: fever, flu-like symptoms, shortness of breath, cough etc.) before then, please call [phone].  If you test positive for Covid 19 in the 2 weeks post procedure, please call and report this information to us.    If any biopsies were taken you will be contacted by phone or by letter within the next 1-3 weeks.  Please call us at [phone] if you have not heard about the biopsies in 3 weeks.    SIGNATURES/CONFIDENTIALITY: You and/or your care partner have signed paperwork which will be entered into your electronic medical record.  These signatures attest to the fact that that the information above on   your After Visit Summary has been reviewed and is understood.  Full responsibility of the confidentiality of this discharge information lies with you and/or your care-partner. 

## 2020-10-05 NOTE — Op Note (Signed)
[place] Patient Name: Sarah Murphy Procedure Date: 10/05/2020 10:24 AM MRN: [id number] Endoscopist: Thornton Park MD, MD Age: 50 Referring MD:  Date of Birth: [birth year] Gender: Female Account #: [id number] Procedure:                Colonoscopy Indications:              Screening for colorectal malignant neoplasm, This                            is the patient's first colonoscopy                           Father with polyps                           Paternal grandfather with colon cancer in his 94s Medicines:                Monitored Anesthesia Care Procedure:                Pre-Anesthesia Assessment:                           - Prior to the procedure, a History and Physical                            was performed, and patient medications and                            allergies were reviewed. The patient's tolerance of                            previous anesthesia was also reviewed. The risks                            and benefits of the procedure and the sedation                            options and risks were discussed with the patient.                            All questions were answered, and informed consent                            was obtained. Prior Anticoagulants: The patient has                            taken no previous anticoagulant or antiplatelet                            agents. ASA Grade Assessment: II - A patient with                            mild systemic disease. After reviewing the risks  and benefits, the patient was deemed in                            satisfactory condition to undergo the procedure.                           After obtaining informed consent, the colonoscope                            was passed under direct vision. Throughout the                            procedure, the patient's blood pressure, pulse, and                            oxygen saturations were monitored continuously. The                             Colonoscope was introduced through the anus and                            advanced to the 3 cm into the ileum. A second                            forward view of the right colon was performed. The                            colonoscopy was technically difficult and complex                            due to a redundant colon, significant looping and a                            tortuous colon. Successful completion of the                            procedure was aided by changing the patient's                            position, withdrawing and reinserting the scope,                            withdrawing the scope and replacing with the                            pediatric colonoscope and applying abdominal                            pressure. The patient tolerated the procedure well.                            The quality of the bowel preparation was excellent.  The terminal ileum, ileocecal valve, appendiceal                            orifice, and rectum were photographed. Scope In: 10:40:00 AM Scope Out: 11:03:28 AM Scope Withdrawal Time: 0 hours 10 minutes 18 seconds  Total Procedure Duration: 0 hours 23 minutes 28 seconds  Findings:                 The perianal and digital rectal examinations were                            normal.                           A few small-mouthed diverticula were found in the                            sigmoid colon.                           A 3 mm polyp was found in the sigmoid colon. The                            polyp was semi-pedunculated. The polyp was removed                            with a cold snare. Resection and retrieval were                            complete. Estimated blood loss was minimal.                           A 1 mm polyp was found in the cecum. The polyp was                            sessile. The polyp was removed with a cold snare.                            Resection  and retrieval were complete. Estimated                            blood loss was minimal.                           The colon was tortuous and redundant. The entire                            examined colon appeared normal on direct and                            retroflexion views. Complications:            No immediate complications. Estimated Blood Loss:     Estimated blood loss: none. Impression:               - Diverticulosis in  the sigmoid colon.                           - One 3 mm polyp in the sigmoid colon, removed with                            a cold snare. Resected and retrieved.                           - One 1 mm polyp in the cecum, removed with a cold                            snare. Resected and retrieved.                           - The entire examined colon is normal on direct and                            retroflexion views. Recommendation:           - Patient has a contact number available for                            emergencies. The signs and symptoms of potential                            delayed complications were discussed with the                            patient. Return to normal activities tomorrow.                            Written discharge instructions were provided to the                            patient.                           - Resume previous diet.                           - Continue present medications.                           - Await pathology results.                           - Repeat colonoscopy date to be determined after                            pending pathology results are reviewed for                            surveillance, factoring in the family history.                           -  Emerging evidence supports eating a diet of                            fruits, vegetables, grains, calcium, and yogurt                            while reducing red meat and alcohol may reduce the                            risk of colon  cancer.                           - Thank you for allowing me to be involved in your                            colon cancer prevention. Thornton Park MD, MD 10/05/2020 11:08:41 AM This report has been signed electronically.

## 2020-10-05 NOTE — Progress Notes (Signed)
pt tolerated well. VSS. awake and to recovery. Report given to RN.  

## 2020-10-09 ENCOUNTER — Ambulatory Visit: Payer: 59

## 2020-10-09 ENCOUNTER — Telehealth: Payer: Self-pay | Admitting: *Deleted

## 2020-10-09 NOTE — Telephone Encounter (Signed)
  Follow up Call-  Call back number 10/05/2020  Post procedure Call Back phone  # [phone]  Permission to leave phone message Yes     Patient questions:  Do you have a fever, pain , or abdominal swelling? Yes.   Pain Score  2 *  Have you tolerated food without any problems? No.  Have you been able to return to your normal activities? No.  Do you have any questions about your discharge instructions: Diet   No. Medications  No. Follow up visit  No.  Do you have questions or concerns about your Care? No.  Actions: * If pain score is 4 or above: Physician/ provider Notified : Joelene Millin L. Beavers, MD. Pt having discomfort under "belly button.  Increases when I eat and it has stayed the same since the procedure."  I read over her report and told her this might be from abdominal pressure that was applied to advance the scope.  Dr. Tarri Glenn, Just an FYI.  Is there anything else you advise?  Thanks, [employer]

## 2020-10-09 NOTE — Telephone Encounter (Signed)
Thank you :)

## 2020-10-09 NOTE — Telephone Encounter (Signed)
Unusual to be having symptoms so many days out from her colonoscopy. Please see if there is any availability in the schedule for an appointment with me or an APP today or tomorrow. Thanks.

## 2020-10-09 NOTE — Telephone Encounter (Signed)
Pt scheduled to see Nicoletta Ba PA 10/11/20 at 10:30am, there were no sooner appts available. Dr. Tarri Glenn aware.

## 2020-10-10 ENCOUNTER — Ambulatory Visit: Payer: PRIVATE HEALTH INSURANCE | Admitting: Physician Assistant

## 2020-10-11 ENCOUNTER — Other Ambulatory Visit: Payer: Self-pay

## 2020-10-11 ENCOUNTER — Encounter: Payer: Self-pay | Admitting: Physician Assistant

## 2020-10-11 ENCOUNTER — Ambulatory Visit (INDEPENDENT_AMBULATORY_CARE_PROVIDER_SITE_OTHER): Payer: PRIVATE HEALTH INSURANCE | Admitting: Physician Assistant

## 2020-10-11 ENCOUNTER — Other Ambulatory Visit (INDEPENDENT_AMBULATORY_CARE_PROVIDER_SITE_OTHER): Payer: PRIVATE HEALTH INSURANCE

## 2020-10-11 ENCOUNTER — Ambulatory Visit (INDEPENDENT_AMBULATORY_CARE_PROVIDER_SITE_OTHER)
Admission: RE | Admit: 2020-10-11 | Discharge: 2020-10-11 | Disposition: A | Discharge status: Home / Self Care | Payer: PRIVATE HEALTH INSURANCE | Source: Ambulatory Visit | Attending: Physician Assistant | Admitting: Physician Assistant

## 2020-10-11 ENCOUNTER — Ambulatory Visit (INDEPENDENT_AMBULATORY_CARE_PROVIDER_SITE_OTHER): Payer: PRIVATE HEALTH INSURANCE | Admitting: *Deleted

## 2020-10-11 ENCOUNTER — Encounter: Payer: Self-pay | Admitting: *Deleted

## 2020-10-11 VITALS — BP 100/60 | HR 64 | Ht 65.0 in | Wt 142.0 lb

## 2020-10-11 DIAGNOSIS — R109 Unspecified abdominal pain: Secondary | ICD-10-CM

## 2020-10-11 DIAGNOSIS — Z23 Encounter for immunization: Secondary | ICD-10-CM | POA: Diagnosis not present

## 2020-10-11 LAB — CBC WITH DIFFERENTIAL/PLATELET
Basophils Absolute: 0.1 10*3/uL (ref 0.0–0.1)
Basophils Relative: 0.9 % (ref 0.0–3.0)
Eosinophils Absolute: 0.1 10*3/uL (ref 0.0–0.7)
Eosinophils Relative: 1.3 % (ref 0.0–5.0)
HCT: 38.6 % (ref 36.0–46.0)
Hemoglobin: 12.9 g/dL (ref 12.0–15.0)
Lymphocytes Relative: 27 % (ref 12.0–46.0)
Lymphs Abs: 1.7 10*3/uL (ref 0.7–4.0)
MCHC: 33.4 g/dL (ref 30.0–36.0)
MCV: 86.6 fl (ref 78.0–100.0)
Monocytes Absolute: 0.4 10*3/uL (ref 0.1–1.0)
Monocytes Relative: 6.6 % (ref 3.0–12.0)
Neutro Abs: 4 10*3/uL (ref 1.4–7.7)
Neutrophils Relative %: 64.2 % (ref 43.0–77.0)
Platelets: 265 10*3/uL (ref 150.0–400.0)
RBC: 4.46 Mil/uL (ref 3.87–5.11)
RDW: 14 % (ref 11.5–15.5)
WBC: 6.3 10*3/uL (ref 4.0–10.5)

## 2020-10-11 MED ORDER — FUROSEMIDE 40 MG PO TABS: 40.0000 mg | ORAL_TABLET | Freq: Every day | ORAL | 3 refills | Status: DC

## 2020-10-11 MED ORDER — AMOXICILLIN-POT CLAVULANATE 875-125 MG PO TABS: 1.0000 | ORAL_TABLET | Freq: Two times a day (BID) | ORAL | 0 refills | Status: DC

## 2020-10-11 MED ORDER — SPIRONOLACTONE 100 MG PO TABS: 100.0000 mg | ORAL_TABLET | Freq: Every day | ORAL | 3 refills | Status: DC

## 2020-10-11 MED ORDER — LACTULOSE 10 G PO PACK: 30.0000 g | PACK | Freq: Every day | ORAL | 4 refills | Status: DC

## 2020-10-11 NOTE — Progress Notes (Signed)
Per orders of Dr. Rogers Blocker, injection of Shingrix 0.5 ml given IM  by Anselmo Pickler, LPN in left deltoid. Patient tolerated injection well. Pt has completed the series.

## 2020-10-11 NOTE — Progress Notes (Signed)
Subjective:    Patient ID: Sarah Murphy, female    DOB: 1970/08/17, 50 y.o.   MRN: [id number]  HPI Sarah Murphy is a pleasant 50 year old female, known to Dr. Tarri Glenn who comes in today for evaluation with complaints of abdominal pain post colonoscopy done 10/05/2020 for screening. She was found to have a few sigmoid diverticuli, and a 3 mm polyp was removed from the sigmoid colon with a cold snare and a 1 mm polyp removed from the cecum with cold snare.  She was noted to have somewhat tortuous redundant colon.  Path on the polyps is still pending Patient says that she did have some discomfort after returning home on the day of the procedure which then persisted through Saturday, 10/06/2020.  She describes mild tenderness and rates this as a 2-3 out of 10.  She felt some lower abdominal pressure after eating.  She did have a bowel movement on Sunday.  No associated fever or chills, no nausea or vomiting.  She continued to have discomfort on Monday into Tuesday and says that Tuesday was "bad" with some increased discomfort with walking.  Yesterday she was able to pass a significant amount of flatus and says she felt better and today continues to feel better though still "slightly" tender in the lower abdomen. She has been able to eat without difficulty  Review of Systems Pertinent positive and negative review of systems were noted in the above HPI section.  All other review of systems was otherwise negative.  Outpatient Encounter Medications as of 10/11/2020  Medication Sig  . amoxicillin-clavulanate (AUGMENTIN) 875-125 MG tablet Take 1 tablet by mouth 2 (two) times daily. For 7 days  . BIOTIN PO Take 1 tablet by mouth daily.  . clindamycin-benzoyl peroxide (BENZACLIN) gel APPLY TO AFFECTED AREA AS DIRECTED AT BEDTIME  . cyanocobalamin 1000 MCG tablet Take 1 tablet by mouth daily.  . cycloSPORINE (RESTASIS) 0.05 % ophthalmic emulsion 1 drop 2 (two) times daily.  . Loteprednol Etabonate (EYSUVIS OP) Apply  1 drop to eye 3 (three) times daily.  . LOW-OGESTREL 0.3-30 MG-MCG tablet Take 1 tablet by mouth daily.  Marland Kitchen MAGNESIUM PO Take 1 tablet by mouth daily.  . TYRVAYA 0.03 MG/ACT SOLN Place 1 spray into the nose in the morning and at bedtime.  . [DISCONTINUED] furosemide (LASIX) 40 MG tablet Take 1 tablet (40 mg total) by mouth daily.  . [DISCONTINUED] lactulose (CEPHULAC) 10 g packet Take 3 packets (30 g total) by mouth daily.  . [DISCONTINUED] spironolactone (ALDACTONE) 100 MG tablet Take 1 tablet (100 mg total) by mouth daily.   No facility-administered encounter medications on file as of 10/11/2020.   No Known Allergies Patient Active Problem List   Diagnosis Date Noted  . Acne 09/09/2019  . Scoliosis 09/07/2018  . Allergic rhinitis 09/07/2018  . Chronically dry eyes 09/07/2018   Social History   Socioeconomic History  . Marital status: Married    Spouse name: Not on file  . Number of children: Not on file  . Years of education: Not on file  . Highest education level: Not on file  Occupational History  . Not on file  Tobacco Use  . Smoking status: Former Smoker    Quit date: 02/05/2004    Years since quitting: 16.6  . Smokeless tobacco: Never Used  Vaping Use  . Vaping Use: Never used  Substance and Sexual Activity  . Alcohol use: Yes    Alcohol/week: 10.0 standard drinks    Types: 10 Standard  drinks or equivalent per week  . Drug use: Never  . Sexual activity: Yes  Other Topics Concern  . Not on file  Social History Narrative  . Not on file   Social Determinants of Health   Financial Resource Strain: Not on file  Food Insecurity: Not on file  Transportation Needs: Not on file  Physical Activity: Not on file  Stress: Not on file  Social Connections: Not on file  Intimate Partner Violence: Not on file    Ms. Robarts's family history includes Arthritis in her father, mother, and sister; Asthma in her maternal grandmother and mother; Atrial fibrillation in her father;  Cancer in her paternal grandfather; Celiac disease in her mother and sister; Colon cancer (age of onset: 23) in her paternal grandmother; Colon polyps (age of onset: 29) in her father and paternal grandmother; Heart disease in her father; Learning disabilities in her sister; Leukemia in her paternal aunt; Miscarriages / Stillbirths in her maternal grandmother; Stroke in her maternal grandmother.      Objective:    Vitals:   10/11/20 1031  BP: 100/60  Pulse: 64  SpO2: 99%    Physical Exam.Well-developed well-nourished white  female  in no acute distress.  Height, Weight 142 , BMI 23.6  HEENT; nontraumatic normocephalic, EOMI, PE R LA, sclera anicteric. Oropharynx; not examined today Neck; supple, no JVD Cardiovascular; regular rate and rhythm with S1-S2, no murmur rub or gallop Pulmonary; Clear bilaterally Abdomen; soft, , nondistended, there is mild tenderness in the left lower quadrant no guarding or rebound, no palpable mass or hepatosplenomegaly, bowel sounds are active Rectal; not done today Skin; benign exam, no jaundice rash or appreciable lesions Extremities; no clubbing cyanosis or edema skin warm and dry Neuro/Psych; alert and oriented x4, grossly nonfocal mood and affect appropriate       Assessment & Plan:   #75 50 year old white female generally in good health status post screening colonoscopy on 10/05/2020 with removal of 2 small polyps, 1 from the sigmoid and one from the cecum both removed with cold snare, found to have mild sigmoid diverticulosis and somewhat tortuous redundant colon. Patient has had some persistent lower abdominal discomfort post procedure-now 6 days post procedure. On exam she has some tenderness in the left lower quadrant.  Patient never had any severe pain, low level of concern for perforation.  I suspect she may have mild sigmoid diverticulitis.  Plan; Plain abdominal films today CBC with differential Start Augmentin 875 mg p.o. twice daily  x7 days. Diet as tolerated If abdominal films and CBC unremarkable patient is asked to complete the 7 days of antibiotics.  She was advised that if she has any worsening of symptoms or symptoms have failed to resolve when she finishes the antibiotics to call for advice.  Meoshia Billing Genia Harold PA-C 10/11/2020   Cc: Vivi Barrack, MD

## 2020-10-11 NOTE — Patient Instructions (Addendum)
If you are age 51 or older, your body mass index should be between 23-30. Your Body mass index is 23.63 kg/m. If this is out of the aforementioned range listed, please consider follow up with your Primary Care Provider.  If you are age 75 or younger, your body mass index should be between 19-25. Your Body mass index is 23.63 kg/m. If this is out of the aformentioned range listed, please consider follow up with your Primary Care Provider.    Your provider has requested that you go to the basement level for lab work before leaving today. Press "B" on the elevator. The lab is located at the first door on the left as you exit the elevator.er has requested that you go to the basement level for lab work before leaving today. Press "B" on the elevator. The lab is located at the first door on the left as you exit the elevator.  Your provider has requested that you have an abdominal x ray before leaving today. Please go to the basement floor to our Radiology department for the test.  We will send Augmentin to your pharmacy to take for 7 days  Call if symptoms worsen or have not approved when finished with antibiotics   Due to recent changes in healthcare laws, you may see the results of your imaging and laboratory studies on MyChart before your provider has had a chance to review them.  We understand that in some cases there may be results that are confusing or concerning to you. Not all laboratory results come back in the same time frame and the provider may be waiting for multiple results in order to interpret others.  Please give [place] 48 hours in order for your provider to thoroughly review all the results before contacting the office for clarification of your results.   I appreciate the  opportunity to care for you  Thank You   Amy Shane Crutch

## 2020-10-18 ENCOUNTER — Encounter: Payer: Self-pay | Admitting: Gastroenterology

## 2020-10-29 ENCOUNTER — Inpatient Hospital Stay: Admission: RE | Admit: 2020-10-29 | Payer: 59 | Source: Ambulatory Visit

## 2020-11-14 ENCOUNTER — Other Ambulatory Visit: Payer: Self-pay | Admitting: *Deleted

## 2020-11-14 MED ORDER — LOW-OGESTREL 0.3-30 MG-MCG PO TABS: ORAL_TABLET | ORAL | 0 refills | Status: DC

## 2020-12-21 ENCOUNTER — Other Ambulatory Visit: Payer: Self-pay

## 2020-12-21 ENCOUNTER — Ambulatory Visit
Admission: RE | Admit: 2020-12-21 | Discharge: 2020-12-21 | Disposition: A | Discharge status: Home / Self Care | Payer: PRIVATE HEALTH INSURANCE | Source: Ambulatory Visit | Attending: Family Medicine | Admitting: Family Medicine

## 2020-12-21 DIAGNOSIS — Z1231 Encounter for screening mammogram for malignant neoplasm of breast: Secondary | ICD-10-CM

## 2020-12-24 ENCOUNTER — Other Ambulatory Visit: Payer: Self-pay | Admitting: Family Medicine

## 2020-12-24 DIAGNOSIS — R928 Other abnormal and inconclusive findings on diagnostic imaging of breast: Secondary | ICD-10-CM

## 2021-01-11 ENCOUNTER — Other Ambulatory Visit: Payer: Self-pay | Admitting: *Deleted

## 2021-01-11 MED ORDER — LOW-OGESTREL 0.3-30 MG-MCG PO TABS: ORAL_TABLET | ORAL | 0 refills | Status: DC

## 2021-01-18 ENCOUNTER — Other Ambulatory Visit: Payer: Self-pay | Admitting: Family Medicine

## 2021-01-18 ENCOUNTER — Ambulatory Visit
Admission: RE | Admit: 2021-01-18 | Discharge: 2021-01-18 | Disposition: A | Discharge status: Home / Self Care | Payer: PRIVATE HEALTH INSURANCE | Source: Ambulatory Visit | Attending: Family Medicine | Admitting: Family Medicine

## 2021-01-18 ENCOUNTER — Other Ambulatory Visit: Payer: Self-pay

## 2021-01-18 ENCOUNTER — Ambulatory Visit: Payer: PRIVATE HEALTH INSURANCE

## 2021-01-18 DIAGNOSIS — R928 Other abnormal and inconclusive findings on diagnostic imaging of breast: Secondary | ICD-10-CM

## 2021-01-18 DIAGNOSIS — R921 Mammographic calcification found on diagnostic imaging of breast: Secondary | ICD-10-CM

## 2021-07-22 ENCOUNTER — Other Ambulatory Visit: Payer: Self-pay | Admitting: Family Medicine

## 2021-07-22 ENCOUNTER — Ambulatory Visit
Admission: RE | Admit: 2021-07-22 | Discharge: 2021-07-22 | Disposition: A | Discharge status: Home / Self Care | Payer: PRIVATE HEALTH INSURANCE | Source: Ambulatory Visit | Attending: Family Medicine | Admitting: Family Medicine

## 2021-07-22 ENCOUNTER — Other Ambulatory Visit: Payer: Self-pay

## 2021-07-22 DIAGNOSIS — R921 Mammographic calcification found on diagnostic imaging of breast: Secondary | ICD-10-CM

## 2021-07-30 ENCOUNTER — Ambulatory Visit (INDEPENDENT_AMBULATORY_CARE_PROVIDER_SITE_OTHER): Payer: PRIVATE HEALTH INSURANCE | Admitting: Family Medicine

## 2021-07-30 ENCOUNTER — Encounter: Payer: Self-pay | Admitting: Family Medicine

## 2021-07-30 VITALS — BP 122/64 | HR 83 | Temp 98.1°F | Ht 65.0 in | Wt 148.0 lb

## 2021-07-30 DIAGNOSIS — R739 Hyperglycemia, unspecified: Secondary | ICD-10-CM | POA: Diagnosis not present

## 2021-07-30 DIAGNOSIS — Z0001 Encounter for general adult medical examination with abnormal findings: Secondary | ICD-10-CM

## 2021-07-30 DIAGNOSIS — M419 Scoliosis, unspecified: Secondary | ICD-10-CM

## 2021-07-30 DIAGNOSIS — L709 Acne, unspecified: Secondary | ICD-10-CM

## 2021-07-30 DIAGNOSIS — Z13220 Encounter for screening for lipoid disorders: Secondary | ICD-10-CM

## 2021-07-30 LAB — LIPID PANEL
Cholesterol: 190 mg/dL (ref 0–200)
HDL: 72.2 mg/dL (ref >39.00)
LDL Cholesterol: 99 mg/dL (ref 0–99)
NonHDL: 117.68
Total CHOL/HDL Ratio: 3
Triglycerides: 95 mg/dL (ref 0.0–149.0)
VLDL: 19 mg/dL (ref 0.0–40.0)

## 2021-07-30 LAB — COMPREHENSIVE METABOLIC PANEL
ALT: 29 U/L (ref 0–35)
AST: 24 U/L (ref 0–37)
Albumin: 4.1 g/dL (ref 3.5–5.2)
Alkaline Phosphatase: 32 U/L — ABNORMAL LOW (ref 39–117)
BUN: 13 mg/dL (ref 6–23)
CO2: 27 mEq/L (ref 19–32)
Calcium: 9.1 mg/dL (ref 8.4–10.5)
Chloride: 103 mEq/L (ref 96–112)
Creatinine, Ser: 0.91 mg/dL (ref 0.40–1.20)
GFR: 73.27 mL/min (ref >60.00)
Glucose, Bld: 91 mg/dL (ref 70–99)
Potassium: 4.1 mEq/L (ref 3.5–5.1)
Sodium: 136 mEq/L (ref 135–145)
Total Bilirubin: 1 mg/dL (ref 0.2–1.2)
Total Protein: 7 g/dL (ref 6.0–8.3)

## 2021-07-30 LAB — CBC
HCT: 40.8 % (ref 36.0–46.0)
Hemoglobin: 13.3 g/dL (ref 12.0–15.0)
MCHC: 32.4 g/dL (ref 30.0–36.0)
MCV: 88.8 fl (ref 78.0–100.0)
Platelets: 264 10*3/uL (ref 150.0–400.0)
RBC: 4.6 Mil/uL (ref 3.87–5.11)
RDW: 14 % (ref 11.5–15.5)
WBC: 5.3 10*3/uL (ref 4.0–10.5)

## 2021-07-30 LAB — TSH: TSH: 1.78 u[IU]/mL (ref 0.35–5.50)

## 2021-07-30 LAB — HEMOGLOBIN A1C: Hgb A1c MFr Bld: 5.7 % (ref 4.6–6.5)

## 2021-07-30 MED ORDER — LOW-OGESTREL 0.3-30 MG-MCG PO TABS: ORAL_TABLET | ORAL | 3 refills | Status: DC

## 2021-07-30 NOTE — Patient Instructions (Signed)
It was very nice to see you today!  We will check blood work today.  I will refill your birth control pill.  We will see back in a year.  Come back sooner if needed.  Take care, Dr Jerline Pain  PLEASE NOTE:  If you had any lab tests please let us know if you have not heard back within a few days. You may see your results on mychart before we have a chance to review them but we will give you a call once they are reviewed by [place]. If we ordered any referrals today, please let us know if you have not heard from their office within the next week.   Please try these tips to maintain a healthy lifestyle:  Eat at least 3 REAL meals and 1-2 snacks per day.  Aim for no more than 5 hours between eating.  If you eat breakfast, please do so within one hour of getting up.   Each meal should contain half fruits/vegetables, one quarter protein, and one quarter carbs (no bigger than a computer mouse)  Cut down on sweet beverages. This includes juice, soda, and sweet tea.   Drink at least 1 glass of water with each meal and aim for at least 8 glasses per day  Exercise at least 150 minutes every week.    Preventive Care 51-76 Years Old, Female Preventive care refers to lifestyle choices and visits with your health care provider that can promote health and wellness. Preventive care visits are also called wellness exams. What can I expect for my preventive care visit? Counseling Your health care provider may ask you questions about your: Medical history, including: Past medical problems. Family medical history. Pregnancy history. Current health, including: Menstrual cycle. Method of birth control. Emotional well-being. Home life and relationship well-being. Sexual activity and sexual health. Lifestyle, including: Alcohol, nicotine or tobacco, and drug use. Access to firearms. Diet, exercise, and sleep habits. Work and work Statistician. Sunscreen use. Safety issues such as seatbelt and bike  helmet use. Physical exam Your health care provider will check your: Height and weight. These may be used to calculate your BMI (body mass index). BMI is a measurement that tells if you are at a healthy weight. Waist circumference. This measures the distance around your waistline. This measurement also tells if you are at a healthy weight and may help predict your risk of certain diseases, such as type 2 diabetes and high blood pressure. Heart rate and blood pressure. Body temperature. Skin for abnormal spots. What immunizations do I need? Vaccines are usually given at various ages, according to a schedule. Your health care provider will recommend vaccines for you based on your age, medical history, and lifestyle or other factors, such as travel or where you work. What tests do I need? Screening Your health care provider may recommend screening tests for certain conditions. This may include: Lipid and cholesterol levels. Diabetes screening. This is done by checking your blood sugar (glucose) after you have not eaten for a while (fasting). Pelvic exam and Pap test. Hepatitis B test. Hepatitis C test. HIV (human immunodeficiency virus) test. STI (sexually transmitted infection) testing, if you are at risk. Lung cancer screening. Colorectal cancer screening. Mammogram. Talk with your health care provider about when you should start having regular mammograms. This may depend on whether you have a family history of breast cancer. BRCA-related cancer screening. This may be done if you have a family history of breast, ovarian, tubal, or peritoneal cancers. Bone  density scan. This is done to screen for osteoporosis. Talk with your health care provider about your test results, treatment options, and if necessary, the need for more tests. Follow these instructions at home: Eating and drinking  Eat a diet that includes fresh fruits and vegetables, whole grains, lean protein, and low-fat dairy  products. Take vitamin and mineral supplements as recommended by your health care provider. Do not drink alcohol if: Your health care provider tells you not to drink. You are pregnant, may be pregnant, or are planning to become pregnant. If you drink alcohol: Limit how much you have to 0-1 drink a day. Know how much alcohol is in your drink. In the [place] one drink equals one 12 oz bottle of beer (355 mL), one 5 oz glass of wine (148 mL), or one 1 oz glass of hard liquor (44 mL). Lifestyle Brush your teeth every morning and night with fluoride toothpaste. Floss one time each day. Exercise for at least 30 minutes 5 or more days each week. Do not use any products that contain nicotine or tobacco. These products include cigarettes, chewing tobacco, and vaping devices, such as e-cigarettes. If you need help quitting, ask your health care provider. Do not use drugs. If you are sexually active, practice safe sex. Use a condom or other form of protection to prevent STIs. If you do not wish to become pregnant, use a form of birth control. If you plan to become pregnant, see your health care provider for a prepregnancy visit. Take aspirin only as told by your health care provider. Make sure that you understand how much to take and what form to take. Work with your health care provider to find out whether it is safe and beneficial for you to take aspirin daily. Find healthy ways to manage stress, such as: Meditation, yoga, or listening to music. Journaling. Talking to a trusted person. Spending time with friends and family. Minimize exposure to UV radiation to reduce your risk of skin cancer. Safety Always wear your seat belt while driving or riding in a vehicle. Do not drive: If you have been drinking alcohol. Do not ride with someone who has been drinking. When you are tired or distracted. While texting. If you have been using any mind-altering substances or drugs. Wear a helmet and other  protective equipment during sports activities. If you have firearms in your house, make sure you follow all gun safety procedures. Seek help if you have been physically or sexually abused. What's next? Visit your health care provider once a year for an annual wellness visit. Ask your health care provider how often you should have your eyes and teeth checked. Stay up to date on all vaccines. This information is not intended to replace advice given to you by your health care provider. Make sure you discuss any questions you have with your health care provider. Document Revised: 01/23/2021 Document Reviewed: 01/23/2021 Elsevier Patient Education  [employer].

## 2021-07-30 NOTE — Assessment & Plan Note (Signed)
Continue BenzaClin as needed.

## 2021-07-30 NOTE — Assessment & Plan Note (Signed)
Follow-up.  We discussed referral to PT however she declined.

## 2021-07-30 NOTE — Progress Notes (Signed)
Chief Complaint:  Sarah Murphy is a 50 y.o. female who presents today for her annual comprehensive physical exam.    Assessment/Plan:  Problems Addressed Today: Scoliosis Follow-up.  We discussed referral to PT however she declined.  Acne Continue BenzaClin as needed.  Abdominal Pain Reassuring exam.  No red flags.  Possibly mild gas pains.  We will continue with watchful waiting.  Discussed reasons to return to care.  Preventative Healthcare: Will get blood work done today. UTD on mammogram and colonoscopy.  Due for pap smear in 2024.   Patient Counseling(The following topics were reviewed and/or handout was given):  -Nutrition: Stressed importance of moderation in sodium/caffeine intake, saturated fat and cholesterol, caloric balance, sufficient intake of fresh fruits, vegetables, and fiber.  -Stressed the importance of regular exercise.   -Substance Abuse: Discussed cessation/primary prevention of tobacco, alcohol, or other drug use; driving or other dangerous activities under the influence; availability of treatment for abuse.   -Injury prevention: Discussed safety belts, safety helmets, smoke detector, smoking near bedding or upholstery.   -Sexuality: Discussed sexually transmitted diseases, partner selection, use of condoms, avoidance of unintended pregnancy and contraceptive alternatives.   -Dental health: Discussed importance of regular tooth brushing, flossing, and dental visits.  -Health maintenance and immunizations reviewed. Please refer to Health maintenance section.  Return to care in 1 year for next preventative visit.     Subjective:  HPI:  She has no acute complaints today.   She has had chronic issue with back pain. Located on the lower back. Started about a year  ago. Certain movements make it worse. Symptoms are manageable. However, pain is still present. Pain is present mostly in the mornings. She tired chiropractor in the past which seems to help. She  also tried massage therapist to help alleviate the pain. No obvious injuries or precipitating events.   She also complain of abdominal pain. This started on 07/29/2021. She notes she was at hockey game when she felt the pain. She reports pain was present morning. No nausea or vomiting.   Depression screen PHQ 2/9 07/30/2021  Decreased Interest 0  Down, Depressed, Hopeless 0  PHQ - 2 Score 0    Health Maintenance Due  Topic Date Due   HIV Screening  Never done   Hepatitis C Screening  Never done   COVID-19 Vaccine (4 - Booster for Pfizer series) 08/18/2020     ROS: Per HPI, otherwise a complete review of systems was negative.   PMH:  The following were reviewed and entered/updated in epic: Past Medical History:  Diagnosis Date   Dry eyes, bilateral    Eczema    Heart murmur    hx of  ( as a child)   Herpes simplex type 1 infection    Raynaud disease    Scoliosis    Patient Active Problem List   Diagnosis Date Noted   Acne 09/09/2019   Scoliosis 09/07/2018   Allergic rhinitis 09/07/2018   Chronically dry eyes 09/07/2018   Past Surgical History:  Procedure Laterality Date   TONSILLECTOMY     WISDOM TOOTH EXTRACTION      Family History  Problem Relation Age of Onset   Asthma Mother    Arthritis Mother    Celiac disease Mother    Arthritis Father    Heart disease Father    Atrial fibrillation Father    Colon polyps Father 4   Arthritis Sister    Learning disabilities Sister    Celiac disease  Sister    Asthma Maternal Grandmother    Miscarriages / Stillbirths Maternal Grandmother    Stroke Maternal Grandmother    Colon cancer Paternal Grandmother 66   Colon polyps Paternal Grandmother 28   Cancer Paternal Grandfather    Leukemia Paternal Aunt    Breast cancer Neg Hx    Esophageal cancer Neg Hx    Rectal cancer Neg Hx    Stomach cancer Neg Hx     Medications- reviewed and updated Current Outpatient Medications  Medication Sig Dispense Refill   BIOTIN  PO Take 1 tablet by mouth daily.     clindamycin-benzoyl peroxide (BENZACLIN) gel APPLY TO AFFECTED AREA AS DIRECTED AT BEDTIME 25 g 0   cyanocobalamin 1000 MCG tablet Take 1 tablet by mouth daily.     Loteprednol Etabonate (EYSUVIS OP) Apply 1 drop to eye 3 (three) times daily.     MAGNESIUM PO Take 1 tablet by mouth daily.     TYRVAYA 0.03 MG/ACT SOLN Place 1 spray into the nose in the morning and at bedtime.     LOW-OGESTREL 0.3-30 MG-MCG tablet Take 1 tablet by mouth daily. 90 tablet 3   No current facility-administered medications for this visit.    Allergies-reviewed and updated No Known Allergies  Social History   Socioeconomic History   Marital status: Married    Spouse name: Not on file   Number of children: Not on file   Years of education: Not on file   Highest education level: Not on file  Occupational History   Not on file  Tobacco Use   Smoking status: Former    Types: Cigarettes    Quit date: 02/05/2004    Years since quitting: 17.4   Smokeless tobacco: Never  Vaping Use   Vaping Use: Never used  Substance and Sexual Activity   Alcohol use: Yes    Alcohol/week: 10.0 standard drinks    Types: 10 Standard drinks or equivalent per week   Drug use: Never   Sexual activity: Yes  Other Topics Concern   Not on file  Social History Narrative   Not on file   Social Determinants of Health   Financial Resource Strain: Not on file  Food Insecurity: Not on file  Transportation Needs: Not on file  Physical Activity: Not on file  Stress: Not on file  Social Connections: Not on file        Objective:  Physical Exam: BP 122/64    Pulse 83    Temp 98.1 F (36.7 C) (Temporal)    Ht 5\' 5"  (1.651 m)    Wt 148 lb (67.1 kg)    LMP 07/25/2021 (Exact Date)    SpO2 100%    BMI 24.63 kg/m   Body mass index is 24.63 kg/m. Wt Readings from Last 3 Encounters:  07/30/21 148 lb (67.1 kg)  10/11/20 142 lb (64.4 kg)  10/05/20 142 lb (64.4 kg)   Gen: NAD, resting  comfortably HEENT: TMs normal bilaterally. OP clear. No thyromegaly noted.  CV: RRR with no murmurs appreciated Pulm: NWOB, CTAB with no crackles, wheezes, or rhonchi GI: Normal bowel sounds present. Soft, Nontender, Nondistended. MSK: no edema, cyanosis, or clubbing noted Skin: warm, dry Neuro: CN2-12 grossly intact. Strength 5/5 in upper and lower extremities. Reflexes symmetric and intact bilaterally.  Psych: Normal affect and thought content      I,Savera Zaman,acting as a scribe for Dimas Chyle, MD.,have documented all relevant documentation on the behalf of Dimas Chyle, MD,as directed  by  Dimas Chyle, MD while in the presence of Dimas Chyle, MD.   I, Dimas Chyle, MD, have reviewed all documentation for this visit. The documentation on 07/30/21 for the exam, diagnosis, procedures, and orders are all accurate and complete.  Algis Greenhouse. Jerline Pain, MD 07/30/2021 11:01 AM

## 2021-07-31 NOTE — Progress Notes (Signed)
Please inform patient of the following:  HEr A1c is borderline elevated but everything else is NORMAL.  Do not need to make any changes to her treatment plan at this time.  She should continue working on diet and exercise and we can recheck in a year or so.

## 2022-01-27 ENCOUNTER — Ambulatory Visit
Admission: RE | Admit: 2022-01-27 | Discharge: 2022-01-27 | Disposition: A | Discharge status: Home / Self Care | Payer: PRIVATE HEALTH INSURANCE | Source: Ambulatory Visit | Attending: Family Medicine | Admitting: Family Medicine

## 2022-01-27 ENCOUNTER — Other Ambulatory Visit: Payer: Self-pay | Admitting: Family Medicine

## 2022-01-27 DIAGNOSIS — R921 Mammographic calcification found on diagnostic imaging of breast: Secondary | ICD-10-CM

## 2022-05-05 ENCOUNTER — Encounter: Payer: Self-pay | Admitting: *Deleted

## 2022-06-16 ENCOUNTER — Other Ambulatory Visit: Payer: Self-pay | Admitting: *Deleted

## 2022-06-16 ENCOUNTER — Telehealth: Payer: Self-pay | Admitting: Family Medicine

## 2022-06-16 MED ORDER — LOW-OGESTREL 0.3-30 MG-MCG PO TABS: ORAL_TABLET | ORAL | 3 refills | Status: DC

## 2022-06-16 NOTE — Telephone Encounter (Signed)
Rx send to [facility]

## 2022-06-16 NOTE — Telephone Encounter (Signed)
  LAST APPOINTMENT DATE:  07/30/21  NEXT APPOINTMENT DATE: 08/06/22  MEDICATION: LOW-OGESTREL 0.3-30 MG-MCG tablet   Is the patient out of medication? Has 2 months left  [facility]ARMACY:  [facility] - [place], [place] - [address] [place], [place] [address] Phone: [phone]  Fax: [phone]

## 2022-07-24 ENCOUNTER — Encounter: Payer: Self-pay | Admitting: *Deleted

## 2022-07-30 ENCOUNTER — Ambulatory Visit
Admission: RE | Admit: 2022-07-30 | Discharge: 2022-07-30 | Disposition: A | Discharge status: Home / Self Care | Payer: PRIVATE HEALTH INSURANCE | Source: Ambulatory Visit | Attending: Family Medicine | Admitting: Family Medicine

## 2022-07-30 ENCOUNTER — Other Ambulatory Visit: Payer: Self-pay | Admitting: Family Medicine

## 2022-07-30 DIAGNOSIS — R921 Mammographic calcification found on diagnostic imaging of breast: Secondary | ICD-10-CM

## 2022-08-06 ENCOUNTER — Encounter: Payer: PRIVATE HEALTH INSURANCE | Admitting: Family Medicine

## 2022-08-20 ENCOUNTER — Encounter: Payer: PRIVATE HEALTH INSURANCE | Admitting: Family Medicine

## 2022-09-09 ENCOUNTER — Encounter: Payer: PRIVATE HEALTH INSURANCE | Admitting: Family Medicine

## 2022-09-24 ENCOUNTER — Encounter: Payer: Self-pay | Admitting: Family Medicine

## 2022-09-24 ENCOUNTER — Ambulatory Visit (INDEPENDENT_AMBULATORY_CARE_PROVIDER_SITE_OTHER): Payer: PRIVATE HEALTH INSURANCE | Admitting: Family Medicine

## 2022-09-24 VITALS — BP 110/70 | HR 72 | Temp 97.8°F | Ht 65.0 in | Wt 149.0 lb

## 2022-09-24 DIAGNOSIS — Z0001 Encounter for general adult medical examination with abnormal findings: Secondary | ICD-10-CM | POA: Diagnosis not present

## 2022-09-24 DIAGNOSIS — Z23 Encounter for immunization: Secondary | ICD-10-CM | POA: Diagnosis not present

## 2022-09-24 DIAGNOSIS — M79673 Pain in unspecified foot: Secondary | ICD-10-CM | POA: Diagnosis not present

## 2022-09-24 DIAGNOSIS — L989 Disorder of the skin and subcutaneous tissue, unspecified: Secondary | ICD-10-CM | POA: Diagnosis not present

## 2022-09-24 DIAGNOSIS — Z13220 Encounter for screening for lipoid disorders: Secondary | ICD-10-CM | POA: Diagnosis not present

## 2022-09-24 DIAGNOSIS — Z114 Encounter for screening for human immunodeficiency virus [HIV]: Secondary | ICD-10-CM

## 2022-09-24 DIAGNOSIS — Z1159 Encounter for screening for other viral diseases: Secondary | ICD-10-CM | POA: Diagnosis not present

## 2022-09-24 DIAGNOSIS — R739 Hyperglycemia, unspecified: Secondary | ICD-10-CM | POA: Diagnosis not present

## 2022-09-24 DIAGNOSIS — L709 Acne, unspecified: Secondary | ICD-10-CM

## 2022-09-24 LAB — CBC
HCT: 41.4 % (ref 36.0–46.0)
Hemoglobin: 13.7 g/dL (ref 12.0–15.0)
MCHC: 33.1 g/dL (ref 30.0–36.0)
MCV: 87.9 fl (ref 78.0–100.0)
Platelets: 297 10*3/uL (ref 150.0–400.0)
RBC: 4.71 Mil/uL (ref 3.87–5.11)
RDW: 14 % (ref 11.5–15.5)
WBC: 6.7 10*3/uL (ref 4.0–10.5)

## 2022-09-24 LAB — COMPREHENSIVE METABOLIC PANEL
ALT: 18 U/L (ref 0–35)
AST: 15 U/L (ref 0–37)
Albumin: 4.2 g/dL (ref 3.5–5.2)
Alkaline Phosphatase: 41 U/L (ref 39–117)
BUN: 14 mg/dL (ref 6–23)
CO2: 26 mEq/L (ref 19–32)
Calcium: 9.6 mg/dL (ref 8.4–10.5)
Chloride: 104 mEq/L (ref 96–112)
Creatinine, Ser: 0.96 mg/dL (ref 0.40–1.20)
GFR: 68.16 mL/min (ref >60.00)
Glucose, Bld: 105 mg/dL — ABNORMAL HIGH (ref 70–99)
Potassium: 4.3 mEq/L (ref 3.5–5.1)
Sodium: 136 mEq/L (ref 135–145)
Total Bilirubin: 0.7 mg/dL (ref 0.2–1.2)
Total Protein: 7.1 g/dL (ref 6.0–8.3)

## 2022-09-24 LAB — LIPID PANEL
Cholesterol: 209 mg/dL — ABNORMAL HIGH (ref 0–200)
HDL: 60.1 mg/dL (ref >39.00)
LDL Cholesterol: 125 mg/dL — ABNORMAL HIGH (ref 0–99)
NonHDL: 149.27
Total CHOL/HDL Ratio: 3
Triglycerides: 123 mg/dL (ref 0.0–149.0)
VLDL: 24.6 mg/dL (ref 0.0–40.0)

## 2022-09-24 LAB — TSH: TSH: 1.78 u[IU]/mL (ref 0.35–5.50)

## 2022-09-24 LAB — HEMOGLOBIN A1C: Hgb A1c MFr Bld: 5.7 % (ref 4.6–6.5)

## 2022-09-24 MED ORDER — CLINDAMYCIN PHOS-BENZOYL PEROX 1-5 % EX GEL: CUTANEOUS | 1 refills | Status: DC

## 2022-09-24 MED ORDER — LOW-OGESTREL 0.3-30 MG-MCG PO TABS: ORAL_TABLET | ORAL | 3 refills | Status: DC

## 2022-09-24 NOTE — Addendum Note (Signed)
Addended by: Betti Cruz on: 09/24/2022 10:00 AM   Modules accepted: Orders

## 2022-09-24 NOTE — Progress Notes (Signed)
Chief Complaint:  Sarah Murphy is a 52 y.o. female who presents today for her annual comprehensive physical exam.    Assessment/Plan:  New/Acute Problems: Foot Pain Likely metatarsalgia.  May have a neuroma.  Will place referral to sports medicine.  She can continue over-the-counter meds as needed in the interim.  Irritated Skin Tag Pedunculated lesions likely irritated skin tag.  Condyloma on the differential though has no other lesions.  We discussed treatment options.Cryotherapy applied today.  See below procedure note.  She tolerated well.  Chronic Problems Addressed Today: Acne She has had a mild flare recently.  Will refill BenzaClin.  This is worked well for her in the past.  Preventative Healthcare: Check labs.  Tdap given today.  Up-to-date on colon cancer screening.  Due for Pap later this year.  She is okay with getting this done here.  We will do this at her next CPE.  Patient Counseling(The following topics were reviewed and/or handout was given):  -Nutrition: Stressed importance of moderation in sodium/caffeine intake, saturated fat and cholesterol, caloric balance, sufficient intake of fresh fruits, vegetables, and fiber.  -Stressed the importance of regular exercise.   -Substance Abuse: Discussed cessation/primary prevention of tobacco, alcohol, or other drug use; driving or other dangerous activities under the influence; availability of treatment for abuse.   -Injury prevention: Discussed safety belts, safety helmets, smoke detector, smoking near bedding or upholstery.   -Sexuality: Discussed sexually transmitted diseases, partner selection, use of condoms, avoidance of unintended pregnancy and contraceptive alternatives.   -Dental health: Discussed importance of regular tooth brushing, flossing, and dental visits.  -Health maintenance and immunizations reviewed. Please refer to Health maintenance section.  Return to care in 1 year for next preventative visit.      Subjective:  HPI:  She has no acute complaints today.   She has a skin tag on right lower abdomen.  Located in her pant line.  Has become more irritated.  No specific treatments tried.  She is having more foot pain. Located both foot but worse on left.  Seems to be worse in the morning.  Sometimes feels a click when she squeezes on the side of her toes.  Lifestyle Diet: Balanced. Trying to get plenty of fruits and vegetables.  Exercise: Works out at [employer] 3 days per week. Does  a lot of biking.      09/24/2022    8:40 AM  Depression screen PHQ 2/9  Decreased Interest 0  Down, Depressed, Hopeless 0  PHQ - 2 Score 0    Health Maintenance Due  Topic Date Due   Hepatitis C Screening  Never done   DTaP/Tdap/Td (8 - Td or Tdap) 07/20/2022   PAP SMEAR-Modifier  10/20/2022     ROS: Per HPI, otherwise a complete review of systems was negative.   PMH:  The following were reviewed and entered/updated in epic: Past Medical History:  Diagnosis Date   Dry eyes, bilateral    Eczema    Heart murmur    hx of  ( as a child)   Herpes simplex type 1 infection    Raynaud disease    Scoliosis    Patient Active Problem List   Diagnosis Date Noted   Acne 09/09/2019   Scoliosis 09/07/2018   Allergic rhinitis 09/07/2018   Chronically dry eyes 09/07/2018   Past Surgical History:  Procedure Laterality Date   TONSILLECTOMY     WISDOM TOOTH EXTRACTION      Family History  Problem Relation Age of Onset   Asthma Mother    Arthritis Mother    Celiac disease Mother    Arthritis Father    Heart disease Father    Atrial fibrillation Father    Colon polyps Father 14   Arthritis Sister    Learning disabilities Sister    Celiac disease Sister    Asthma Maternal Grandmother    Miscarriages / Stillbirths Maternal Grandmother    Stroke Maternal Grandmother    Colon cancer Paternal Grandmother 55   Colon polyps Paternal Grandmother 23   Cancer Paternal Grandfather    Leukemia  Paternal Aunt    Breast cancer Neg Hx    Esophageal cancer Neg Hx    Rectal cancer Neg Hx    Stomach cancer Neg Hx     Medications- reviewed and updated Current Outpatient Medications  Medication Sig Dispense Refill   BIOTIN PO Take 1 tablet by mouth daily.     cyanocobalamin 1000 MCG tablet Take 1 tablet by mouth daily.     Loteprednol Etabonate (EYSUVIS OP) Apply 1 drop to eye 3 (three) times daily.     MAGNESIUM PO Take 1 tablet by mouth daily.     clindamycin-benzoyl peroxide (BENZACLIN) gel APPLY TO AFFECTED AREA AS DIRECTED AT BEDTIME 25 g 1   LOW-OGESTREL 0.3-30 MG-MCG tablet Take 1 tablet by mouth daily. 90 tablet 3   No current facility-administered medications for this visit.    Allergies-reviewed and updated No Known Allergies  Social History   Socioeconomic History   Marital status: Married    Spouse name: Not on file   Number of children: Not on file   Years of education: Not on file   Highest education level: Not on file  Occupational History   Not on file  Tobacco Use   Smoking status: Former    Types: Cigarettes    Quit date: 02/05/2004    Years since quitting: 18.6   Smokeless tobacco: Never  Vaping Use   Vaping Use: Never used  Substance and Sexual Activity   Alcohol use: Yes    Alcohol/week: 10.0 standard drinks of alcohol    Types: 10 Standard drinks or equivalent per week   Drug use: Never   Sexual activity: Yes  Other Topics Concern   Not on file  Social History Narrative   Not on file   Social Determinants of Health   Financial Resource Strain: Not on file  Food Insecurity: Not on file  Transportation Needs: Not on file  Physical Activity: Not on file  Stress: Not on file  Social Connections: Not on file        Objective:  Physical Exam: BP 110/70   Pulse 72   Temp 97.8 F (36.6 C) (Temporal)   Ht 5' 5"$  (1.651 m)   Wt 149 lb (67.6 kg)   LMP 09/17/2022   SpO2 98%   BMI 24.79 kg/m   Body mass index is 24.79 kg/m. Wt  Readings from Last 3 Encounters:  09/24/22 149 lb (67.6 kg)  07/30/21 148 lb (67.1 kg)  10/11/20 142 lb (64.4 kg)   Gen: NAD, resting comfortably HEENT: TMs normal bilaterally. OP clear. No thyromegaly noted.  CV: RRR with no murmurs appreciated Pulm: NWOB, CTAB with no crackles, wheezes, or rhonchi GI: Normal bowel sounds present. Soft, Nontender, Nondistended. MSK: no edema, cyanosis, or clubbing noted - Feet: Loss of transverse arch noted.  No palpable click with lateral squeeze. Skin: warm, dry. 2 small discrete  inflammted pedunculated skin lesions approximately 2 to 3 mm in diameter in right inguinal crease.  Chaperone present for exam. Neuro: CN2-12 grossly intact. Strength 5/5 in upper and lower extremities. Reflexes symmetric and intact bilaterally.  Psych: Normal affect and thought content  Cryotherapy Procedure Note  Pre-operative Diagnosis: inflamed skin tag  Locations: right inguinal crease  Indications: Therapeutic  Procedure Details  Patient informed of risks (permanent scarring, infection, light or dark discoloration, bleeding, infection, weakness, numbness and recurrence of the lesion) and benefits of the procedure and verbal informed consent obtained.  The areas are treated with liquid nitrogen therapy, frozen until ice ball extended 3 mm beyond lesion, allowed to thaw, and treated again. The patient tolerated procedure well.  A total of 2 lesions were treated. The patient was instructed on post-op care, warned that there may be blister formation, redness and pain. Recommend OTC analgesia as needed for pain.  Condition: Stable  Complications: none.      Algis Greenhouse. Jerline Pain, MD 09/24/2022 9:20 AM

## 2022-09-24 NOTE — Assessment & Plan Note (Signed)
She has had a mild flare recently.  Will refill BenzaClin.  This is worked well for her in the past.

## 2022-09-24 NOTE — Addendum Note (Signed)
Addended by: Vivi Barrack on: 09/24/2022 09:20 AM   Modules accepted: Level of Service

## 2022-09-24 NOTE — Patient Instructions (Signed)
It was very nice to see you today!  We froze your skin tags today.  We will refer you to see sports medicine for your feet.  Please work on diet and exercise.  We will give you your tetanus shot today.  Will check blood work.  Will see back in a year for your next physical.  Come back sooner if needed.  Take care, Dr Jerline Pain  PLEASE NOTE:  If you had any lab tests, please let us know if you have not heard back within a few days. You may see your results on mychart before we have a chance to review them but we will give you a call once they are reviewed by [place].   If we ordered any referrals today, please let us know if you have not heard from their office within the next week.   If you had any urgent prescriptions sent in today, please check with the pharmacy within an hour of our visit to make sure the prescription was transmitted appropriately.   Please try these tips to maintain a healthy lifestyle:  Eat at least 3 REAL meals and 1-2 snacks per day.  Aim for no more than 5 hours between eating.  If you eat breakfast, please do so within one hour of getting up.   Each meal should contain half fruits/vegetables, one quarter protein, and one quarter carbs (no bigger than a computer mouse)  Cut down on sweet beverages. This includes juice, soda, and sweet tea.   Drink at least 1 glass of water with each meal and aim for at least 8 glasses per day  Exercise at least 150 minutes every week.    Preventive Care 53-53 Years Old, Female Preventive care refers to lifestyle choices and visits with your health care provider that can promote health and wellness. Preventive care visits are also called wellness exams. What can I expect for my preventive care visit? Counseling Your health care provider may ask you questions about your: Medical history, including: Past medical problems. Family medical history. Pregnancy history. Current health, including: Menstrual cycle. Method of  birth control. Emotional well-being. Home life and relationship well-being. Sexual activity and sexual health. Lifestyle, including: Alcohol, nicotine or tobacco, and drug use. Access to firearms. Diet, exercise, and sleep habits. Work and work Statistician. Sunscreen use. Safety issues such as seatbelt and bike helmet use. Physical exam Your health care provider will check your: Height and weight. These may be used to calculate your BMI (body mass index). BMI is a measurement that tells if you are at a healthy weight. Waist circumference. This measures the distance around your waistline. This measurement also tells if you are at a healthy weight and may help predict your risk of certain diseases, such as type 2 diabetes and high blood pressure. Heart rate and blood pressure. Body temperature. Skin for abnormal spots. What immunizations do I need?  Vaccines are usually given at various ages, according to a schedule. Your health care provider will recommend vaccines for you based on your age, medical history, and lifestyle or other factors, such as travel or where you work. What tests do I need? Screening Your health care provider may recommend screening tests for certain conditions. This may include: Lipid and cholesterol levels. Diabetes screening. This is done by checking your blood sugar (glucose) after you have not eaten for a while (fasting). Pelvic exam and Pap test. Hepatitis B test. Hepatitis C test. HIV (human immunodeficiency virus) test. STI (sexually transmitted infection)  testing, if you are at risk. Lung cancer screening. Colorectal cancer screening. Mammogram. Talk with your health care provider about when you should start having regular mammograms. This may depend on whether you have a family history of breast cancer. BRCA-related cancer screening. This may be done if you have a family history of breast, ovarian, tubal, or peritoneal cancers. Bone density scan. This  is done to screen for osteoporosis. Talk with your health care provider about your test results, treatment options, and if necessary, the need for more tests. Follow these instructions at home: Eating and drinking  Eat a diet that includes fresh fruits and vegetables, whole grains, lean protein, and low-fat dairy products. Take vitamin and mineral supplements as recommended by your health care provider. Do not drink alcohol if: Your health care provider tells you not to drink. You are pregnant, may be pregnant, or are planning to become pregnant. If you drink alcohol: Limit how much you have to 0-1 drink a day. Know how much alcohol is in your drink. In the [place] one drink equals one 12 oz bottle of beer (355 mL), one 5 oz glass of wine (148 mL), or one 1 oz glass of hard liquor (44 mL). Lifestyle Brush your teeth every morning and night with fluoride toothpaste. Floss one time each day. Exercise for at least 30 minutes 5 or more days each week. Do not use any products that contain nicotine or tobacco. These products include cigarettes, chewing tobacco, and vaping devices, such as e-cigarettes. If you need help quitting, ask your health care provider. Do not use drugs. If you are sexually active, practice safe sex. Use a condom or other form of protection to prevent STIs. If you do not wish to become pregnant, use a form of birth control. If you plan to become pregnant, see your health care provider for a prepregnancy visit. Take aspirin only as told by your health care provider. Make sure that you understand how much to take and what form to take. Work with your health care provider to find out whether it is safe and beneficial for you to take aspirin daily. Find healthy ways to manage stress, such as: Meditation, yoga, or listening to music. Journaling. Talking to a trusted person. Spending time with friends and family. Minimize exposure to UV radiation to reduce your risk of skin  cancer. Safety Always wear your seat belt while driving or riding in a vehicle. Do not drive: If you have been drinking alcohol. Do not ride with someone who has been drinking. When you are tired or distracted. While texting. If you have been using any mind-altering substances or drugs. Wear a helmet and other protective equipment during sports activities. If you have firearms in your house, make sure you follow all gun safety procedures. Seek help if you have been physically or sexually abused. What's next? Visit your health care provider once a year for an annual wellness visit. Ask your health care provider how often you should have your eyes and teeth checked. Stay up to date on all vaccines. This information is not intended to replace advice given to you by your health care provider. Make sure you discuss any questions you have with your health care provider. Document Revised: 01/23/2021 Document Reviewed: 01/23/2021 Elsevier Patient Education  [place].

## 2022-09-25 ENCOUNTER — Encounter: Payer: Self-pay | Admitting: Family Medicine

## 2022-09-25 ENCOUNTER — Ambulatory Visit (INDEPENDENT_AMBULATORY_CARE_PROVIDER_SITE_OTHER): Payer: PRIVATE HEALTH INSURANCE | Admitting: Family Medicine

## 2022-09-25 VITALS — BP 110/60 | HR 89 | Ht 65.0 in | Wt 149.2 lb

## 2022-09-25 DIAGNOSIS — M7742 Metatarsalgia, left foot: Secondary | ICD-10-CM | POA: Insufficient documentation

## 2022-09-25 LAB — HIV ANTIBODY (ROUTINE TESTING W REFLEX): HIV 1&2 Ab, 4th Generation: NONREACTIVE

## 2022-09-25 LAB — HEPATITIS C ANTIBODY: Hepatitis C Ab: NONREACTIVE

## 2022-09-25 NOTE — Progress Notes (Signed)
   I, Josepha Pigg, CMA acting as a Education administrator for Lynne Leader, MD.  Subjective:    CC: L foot pain  HPI: Pt is a 52 y/o female c/o L foot pain x 6-8 months. MOI unknown. Relatively active. Started working out again in Jan 2024.    Pt locates pain to metatarsals, dull, intermittent.  Aggravates: wakes in the middle of the night with dull pain. Denies swelling. Treatments tried: none  Pertinent review of Systems: No fevers or chills  Relevant historical information: Scoliosis   Objective:    Vitals:   09/25/22 0750  BP: 110/60  Pulse: 89  SpO2: 98%   General: Well Developed, well nourished, and in no acute distress.   MSK: Left foot: Normal-appearing Tender palpation second third and fourth metatarsal heads plantar aspect.  Normal foot and ankle motion. Pulses cap refill and sensation are intact. Strength is intact. Stable ligamentous exam.  Inspection of her shoe shows a ware imprint at the first and third metatarsal indicating excessive pressure at third metatarsal head      Impression and Recommendations:    Assessment and Plan: 52 y.o. female with left foot pain.  Agree with Dr. Marigene Ehlers diagnosis of metatarsalgia.  Plan for metatarsal pads.  Recheck in 1 month..   Discussed warning signs or symptoms. Please see discharge instructions. Patient expresses understanding.   The above documentation has been reviewed and is accurate and complete Lynne Leader, M.D.

## 2022-09-25 NOTE — Patient Instructions (Addendum)
Thank you for coming in today.   Hapad metatarsal pads small.   Let me know if you have trouble positioning them.   Recheck in 1 month.

## 2022-09-26 NOTE — Progress Notes (Signed)
Please inform patient of the following:  Her cholesterol is up a bit since last time but everything else is stable.  Do not need to start meds.  She should continue to work on diet and exercise and we can recheck everything in a year.

## 2022-10-24 ENCOUNTER — Ambulatory Visit: Payer: PRIVATE HEALTH INSURANCE | Admitting: Family Medicine

## 2023-02-02 ENCOUNTER — Ambulatory Visit
Admission: RE | Admit: 2023-02-02 | Discharge: 2023-02-02 | Disposition: A | Discharge status: Home / Self Care | Payer: PRIVATE HEALTH INSURANCE | Source: Ambulatory Visit | Attending: Family Medicine | Admitting: Family Medicine

## 2023-02-02 DIAGNOSIS — R921 Mammographic calcification found on diagnostic imaging of breast: Secondary | ICD-10-CM

## 2023-07-27 ENCOUNTER — Telehealth: Payer: Self-pay | Admitting: Family Medicine

## 2023-07-27 MED ORDER — LOW-OGESTREL 0.3-30 MG-MCG PO TABS: ORAL_TABLET | ORAL | 3 refills | Status: DC

## 2023-07-27 NOTE — Telephone Encounter (Signed)
Noted  

## 2023-07-27 NOTE — Telephone Encounter (Signed)
Please see message below and advise.

## 2023-07-27 NOTE — Telephone Encounter (Signed)
Prescription Request  07/27/2023  LOV: 09/24/2022  What is the name of the medication or equipment?  LOW-OGESTREL 0.3-30 MG-MCG tablet    Have you contacted your pharmacy to request a refill? No   Which pharmacy would you like this sent to? [facility] - [place], [place] - [address] [place] [address] Phone: [phone] Fax: [phone]   Patient notified that their request is being sent to the clinical staff for review and that they should receive a response within 2 business days.   Please advise at Mobile [phone] (mobile)

## 2023-10-08 ENCOUNTER — Ambulatory Visit: Payer: Managed Care, Other (non HMO) | Admitting: Family Medicine

## 2023-10-08 ENCOUNTER — Encounter: Payer: Self-pay | Admitting: Family Medicine

## 2023-10-08 ENCOUNTER — Other Ambulatory Visit (HOSPITAL_COMMUNITY)
Admission: RE | Admit: 2023-10-08 | Discharge: 2023-10-08 | Disposition: A | Discharge status: Home / Self Care | Source: Ambulatory Visit | Attending: Family Medicine | Admitting: Family Medicine

## 2023-10-08 VITALS — BP 113/69 | HR 68 | Temp 98.4°F | Ht 65.0 in | Wt 151.6 lb

## 2023-10-08 DIAGNOSIS — J309 Allergic rhinitis, unspecified: Secondary | ICD-10-CM

## 2023-10-08 DIAGNOSIS — Z13220 Encounter for screening for lipoid disorders: Secondary | ICD-10-CM

## 2023-10-08 DIAGNOSIS — Z131 Encounter for screening for diabetes mellitus: Secondary | ICD-10-CM

## 2023-10-08 DIAGNOSIS — L709 Acne, unspecified: Secondary | ICD-10-CM

## 2023-10-08 DIAGNOSIS — Z309 Encounter for contraceptive management, unspecified: Secondary | ICD-10-CM | POA: Diagnosis not present

## 2023-10-08 DIAGNOSIS — Z124 Encounter for screening for malignant neoplasm of cervix: Secondary | ICD-10-CM | POA: Diagnosis present

## 2023-10-08 DIAGNOSIS — Z0001 Encounter for general adult medical examination with abnormal findings: Secondary | ICD-10-CM | POA: Diagnosis not present

## 2023-10-08 LAB — COMPREHENSIVE METABOLIC PANEL
ALT: 15 U/L (ref 0–35)
AST: 15 U/L (ref 0–37)
Albumin: 4.2 g/dL (ref 3.5–5.2)
Alkaline Phosphatase: 32 U/L — ABNORMAL LOW (ref 39–117)
BUN: 10 mg/dL (ref 6–23)
CO2: 25 meq/L (ref 19–32)
Calcium: 9.1 mg/dL (ref 8.4–10.5)
Chloride: 104 meq/L (ref 96–112)
Creatinine, Ser: 1.11 mg/dL (ref 0.40–1.20)
GFR: 56.85 mL/min — ABNORMAL LOW (ref >60.00)
Glucose, Bld: 88 mg/dL (ref 70–99)
Potassium: 3.9 meq/L (ref 3.5–5.1)
Sodium: 137 meq/L (ref 135–145)
Total Bilirubin: 0.9 mg/dL (ref 0.2–1.2)
Total Protein: 7.3 g/dL (ref 6.0–8.3)

## 2023-10-08 LAB — CBC
HCT: 39.6 % (ref 36.0–46.0)
Hemoglobin: 13 g/dL (ref 12.0–15.0)
MCHC: 32.8 g/dL (ref 30.0–36.0)
MCV: 89.5 fL (ref 78.0–100.0)
Platelets: 293 10*3/uL (ref 150.0–400.0)
RBC: 4.43 Mil/uL (ref 3.87–5.11)
RDW: 14.2 % (ref 11.5–15.5)
WBC: 6.2 10*3/uL (ref 4.0–10.5)

## 2023-10-08 LAB — LIPID PANEL
Cholesterol: 203 mg/dL — ABNORMAL HIGH (ref 0–200)
HDL: 60.8 mg/dL (ref >39.00)
LDL Cholesterol: 118 mg/dL — ABNORMAL HIGH (ref 0–99)
NonHDL: 141.85
Total CHOL/HDL Ratio: 3
Triglycerides: 119 mg/dL (ref 0.0–149.0)
VLDL: 23.8 mg/dL (ref 0.0–40.0)

## 2023-10-08 LAB — TSH: TSH: 1.41 u[IU]/mL (ref 0.35–5.50)

## 2023-10-08 LAB — HEMOGLOBIN A1C: Hgb A1c MFr Bld: 5.6 % (ref 4.6–6.5)

## 2023-10-08 MED ORDER — LOW-OGESTREL 0.3-30 MG-MCG PO TABS: ORAL_TABLET | ORAL | 3 refills | Status: DC

## 2023-10-08 MED ORDER — CLINDAMYCIN PHOS-BENZOYL PEROX 1-5 % EX GEL: CUTANEOUS | 1 refills | Status: AC

## 2023-10-08 NOTE — Progress Notes (Signed)
 Chief Complaint:  Sarah Murphy is a 53 y.o. female who presents today for her annual comprehensive physical exam.    Assessment/Plan:  Chronic Problems Addressed Today: Contraception management She is currently on OCPs.  She has been on this for many years.  Will refill today.  Discussed that we should probably discontinue this as she gets into her later 37s due to concern for potential side effects.  She is agreeable to this plan.  Will refill today.  Acne On BenzaClin.  Has follow-up with dermatology soon.  Preventative Healthcare: Check labs. UpToDate on vaccines. Pap test today.   Patient Counseling(The following topics were reviewed and/or handout was given):  -Nutrition: Stressed importance of moderation in sodium/caffeine intake, saturated fat and cholesterol, caloric balance, sufficient intake of fresh fruits, vegetables, and fiber.  -Stressed the importance of regular exercise.   -Substance Abuse: Discussed cessation/primary prevention of tobacco, alcohol, or other drug use; driving or other dangerous activities under the influence; availability of treatment for abuse.   -Injury prevention: Discussed safety belts, safety helmets, smoke detector, smoking near bedding or upholstery.   -Sexuality: Discussed sexually transmitted diseases, partner selection, use of condoms, avoidance of unintended pregnancy and contraceptive alternatives.   -Dental health: Discussed importance of regular tooth brushing, flossing, and dental visits.  -Health maintenance and immunizations reviewed. Please refer to Health maintenance section.  Return to care in 1 year for next preventative visit.     Subjective:  HPI:  She has no acute complaints today. See Assessment / plan for status of chronic conditions.   Lifestyle Diet: Trying to get more fruits and vegetables.  Exercise: Larey Seat off recently but trying to get back into this.      10/08/2023    9:51 AM  Depression screen PHQ 2/9   Decreased Interest 0  Down, Depressed, Hopeless 0  PHQ - 2 Score 0    Health Maintenance Due  Topic Date Due   Cervical Cancer Screening (HPV/Pap Cotest)  Never done   INFLUENZA VACCINE  03/12/2023     ROS: Per HPI, otherwise a complete review of systems was negative.   PMH:  The following were reviewed and entered/updated in epic: Past Medical History:  Diagnosis Date   Dry eyes, bilateral    Eczema    Heart murmur    hx of  ( as a child)   Herpes simplex type 1 infection    Raynaud disease    Scoliosis    Patient Active Problem List   Diagnosis Date Noted   Metatarsalgia of left foot 09/25/2022   Acne 09/09/2019   Scoliosis 09/07/2018   Allergic rhinitis 09/07/2018   Contraception management 09/07/2018   Chronically dry eyes 09/07/2018   Past Surgical History:  Procedure Laterality Date   TONSILLECTOMY     WISDOM TOOTH EXTRACTION      Family History  Problem Relation Age of Onset   Asthma Mother    Arthritis Mother    Celiac disease Mother    Arthritis Father    Heart disease Father    Atrial fibrillation Father    Colon polyps Father 38   Arthritis Sister    Learning disabilities Sister    Celiac disease Sister    Asthma Maternal Grandmother    Miscarriages / Stillbirths Maternal Grandmother    Stroke Maternal Grandmother    Colon cancer Paternal Grandmother 71   Colon polyps Paternal Grandmother 31   Cancer Paternal Grandfather    Leukemia Paternal Aunt  Breast cancer Neg Hx    Esophageal cancer Neg Hx    Rectal cancer Neg Hx    Stomach cancer Neg Hx     Medications- reviewed and updated Current Outpatient Medications  Medication Sig Dispense Refill   BIOTIN PO Take 1 tablet by mouth daily.     clindamycin-benzoyl peroxide (BENZACLIN) gel APPLY TO AFFECTED AREA AS DIRECTED AT BEDTIME 25 g 1   LOW-OGESTREL 0.3-30 MG-MCG tablet Take 1 tablet by mouth daily. 90 tablet 3   No current facility-administered medications for this visit.     Allergies-reviewed and updated No Known Allergies  Social History   Socioeconomic History   Marital status: Married    Spouse name: Not on file   Number of children: Not on file   Years of education: Not on file   Highest education level: Bachelor's degree (e.g., BA, AB, BS)  Occupational History   Not on file  Tobacco Use   Smoking status: Former    Current packs/day: 0.00    Types: Cigarettes    Quit date: 02/05/2004    Years since quitting: 19.6   Smokeless tobacco: Never  Vaping Use   Vaping status: Never Used  Substance and Sexual Activity   Alcohol use: Yes    Alcohol/week: 10.0 standard drinks of alcohol    Types: 10 Standard drinks or equivalent per week   Drug use: Never   Sexual activity: Yes  Other Topics Concern   Not on file  Social History Narrative   Not on file   Social Drivers of Health   Financial Resource Strain: Low Risk  (10/04/2023)   Overall Financial Resource Strain (CARDIA)    Difficulty of Paying Living Expenses: Not hard at all  Food Insecurity: No Food Insecurity (10/04/2023)   Hunger Vital Sign    Worried About Running Out of Food in the Last Year: Never true    Ran Out of Food in the Last Year: Never true  Transportation Needs: No Transportation Needs (10/04/2023)   PRAPARE - Administrator, Civil Service (Medical): No    Lack of Transportation (Non-Medical): No  Physical Activity: Unknown (10/04/2023)   Exercise Vital Sign    Days of Exercise per Week: 0 days    Minutes of Exercise per Session: Not on file  Stress: No Stress Concern Present (10/04/2023)   [employer] of Occupational Health - Occupational Stress Questionnaire    Feeling of Stress : Only a little  Social Connections: Socially Isolated (10/04/2023)   Social Connection and Isolation Panel [NHANES]    Frequency of Communication with Friends and Family: Once a week    Frequency of Social Gatherings with Friends and Family: Once a week    Attends  Religious Services: Never    Database administrator or Organizations: No    Attends Engineer, structural: Not on file    Marital Status: Married        Objective:  Physical Exam: BP 113/69   Pulse 68   Temp 98.4 F (36.9 C) (Temporal)   Ht 5\' 5"  (1.651 m)   Wt 151 lb 9.6 oz (68.8 kg)   LMP 09/09/2023   SpO2 100%   BMI 25.23 kg/m   Body mass index is 25.23 kg/m. Wt Readings from Last 3 Encounters:  10/08/23 151 lb 9.6 oz (68.8 kg)  09/25/22 149 lb 3.2 oz (67.7 kg)  09/24/22 149 lb (67.6 kg)   Gen: NAD, resting comfortably HEENT: TMs normal bilaterally.  OP clear. No thyromegaly noted.  CV: RRR with no murmurs appreciated Pulm: NWOB, CTAB with no crackles, wheezes, or rhonchi GI: Normal bowel sounds present. Soft, Nontender, Nondistended. GU: Normal external and internal female genitalia.  Chaperone present for exam. MSK: no edema, cyanosis, or clubbing noted Skin: warm, dry Neuro: CN2-12 grossly intact. Strength 5/5 in upper and lower extremities. Reflexes symmetric and intact bilaterally.  Psych: Normal affect and thought content     Maryruth Apple M. Jimmey Ralph, MD 10/08/2023 10:37 AM

## 2023-10-08 NOTE — Assessment & Plan Note (Signed)
 On BenzaClin.  Has follow-up with dermatology soon.

## 2023-10-08 NOTE — Patient Instructions (Addendum)
 It was very nice to see you today!  We will check blood work today.  We completed your pap smear today.  I will refill your medications today.   Please continue working on diet and exercise.   Return in about 1 year (around 10/07/2024).   Take care, Dr Jimmey Ralph  PLEASE NOTE:  If you had any lab tests, please let us know if you have not heard back within a few days. You may see your results on mychart before we have a chance to review them but we will give you a call once they are reviewed by [place].   If we ordered any referrals today, please let us know if you have not heard from their office within the next week.   If you had any urgent prescriptions sent in today, please check with the pharmacy within an hour of our visit to make sure the prescription was transmitted appropriately.   Please try these tips to maintain a healthy lifestyle:  Eat at least 3 REAL meals and 1-2 snacks per day.  Aim for no more than 5 hours between eating.  If you eat breakfast, please do so within one hour of getting up.   Each meal should contain half fruits/vegetables, one quarter protein, and one quarter carbs (no bigger than a computer mouse)  Cut down on sweet beverages. This includes juice, soda, and sweet tea.   Drink at least 1 glass of water with each meal and aim for at least 8 glasses per day  Exercise at least 150 minutes every week.     Preventive Care 54-25 Years Old, Female Preventive care refers to lifestyle choices and visits with your health care provider that can promote health and wellness. Preventive care visits are also called wellness exams. What can I expect for my preventive care visit? Counseling Your health care provider may ask you questions about your: Medical history, including: Past medical problems. Family medical history. Pregnancy history. Current health, including: Menstrual cycle. Method of birth control. Emotional well-being. Home life and relationship  well-being. Sexual activity and sexual health. Lifestyle, including: Alcohol, nicotine or tobacco, and drug use. Access to firearms. Diet, exercise, and sleep habits. Work and work Astronomer. Sunscreen use. Safety issues such as seatbelt and bike helmet use. Physical exam Your health care provider will check your: Height and weight. These may be used to calculate your BMI (body mass index). BMI is a measurement that tells if you are at a healthy weight. Waist circumference. This measures the distance around your waistline. This measurement also tells if you are at a healthy weight and may help predict your risk of certain diseases, such as type 2 diabetes and high blood pressure. Heart rate and blood pressure. Body temperature. Skin for abnormal spots. What immunizations do I need?  Vaccines are usually given at various ages, according to a schedule. Your health care provider will recommend vaccines for you based on your age, medical history, and lifestyle or other factors, such as travel or where you work. What tests do I need? Screening Your health care provider may recommend screening tests for certain conditions. This may include: Lipid and cholesterol levels. Diabetes screening. This is done by checking your blood sugar (glucose) after you have not eaten for a while (fasting). Pelvic exam and Pap test. Hepatitis B test. Hepatitis C test. HIV (human immunodeficiency virus) test. STI (sexually transmitted infection) testing, if you are at risk. Lung cancer screening. Colorectal cancer screening. Mammogram. Talk with your  health care provider about when you should start having regular mammograms. This may depend on whether you have a family history of breast cancer. BRCA-related cancer screening. This may be done if you have a family history of breast, ovarian, tubal, or peritoneal cancers. Bone density scan. This is done to screen for osteoporosis. Talk with your health care  provider about your test results, treatment options, and if necessary, the need for more tests. Follow these instructions at home: Eating and drinking  Eat a diet that includes fresh fruits and vegetables, whole grains, lean protein, and low-fat dairy products. Take vitamin and mineral supplements as recommended by your health care provider. Do not drink alcohol if: Your health care provider tells you not to drink. You are pregnant, may be pregnant, or are planning to become pregnant. If you drink alcohol: Limit how much you have to 0-1 drink a day. Know how much alcohol is in your drink. In the [place] one drink equals one 12 oz bottle of beer (355 mL), one 5 oz glass of wine (148 mL), or one 1 oz glass of hard liquor (44 mL). Lifestyle Brush your teeth every morning and night with fluoride toothpaste. Floss one time each day. Exercise for at least 30 minutes 5 or more days each week. Do not use any products that contain nicotine or tobacco. These products include cigarettes, chewing tobacco, and vaping devices, such as e-cigarettes. If you need help quitting, ask your health care provider. Do not use drugs. If you are sexually active, practice safe sex. Use a condom or other form of protection to prevent STIs. If you do not wish to become pregnant, use a form of birth control. If you plan to become pregnant, see your health care provider for a prepregnancy visit. Take aspirin only as told by your health care provider. Make sure that you understand how much to take and what form to take. Work with your health care provider to find out whether it is safe and beneficial for you to take aspirin daily. Find healthy ways to manage stress, such as: Meditation, yoga, or listening to music. Journaling. Talking to a trusted person. Spending time with friends and family. Minimize exposure to UV radiation to reduce your risk of skin cancer. Safety Always wear your seat belt while driving or riding in  a vehicle. Do not drive: If you have been drinking alcohol. Do not ride with someone who has been drinking. When you are tired or distracted. While texting. If you have been using any mind-altering substances or drugs. Wear a helmet and other protective equipment during sports activities. If you have firearms in your house, make sure you follow all gun safety procedures. Seek help if you have been physically or sexually abused. What's next? Visit your health care provider once a year for an annual wellness visit. Ask your health care provider how often you should have your eyes and teeth checked. Stay up to date on all vaccines. This information is not intended to replace advice given to you by your health care provider. Make sure you discuss any questions you have with your health care provider. Document Revised: 01/23/2021 Document Reviewed: 01/23/2021 Elsevier Patient Education  2024 [employer].

## 2023-10-08 NOTE — Assessment & Plan Note (Signed)
 She is currently on OCPs.  She has been on this for many years.  Will refill today.  Discussed that we should probably discontinue this as she gets into her later 89s due to concern for potential side effects.  She is agreeable to this plan.  Will refill today.

## 2023-10-13 ENCOUNTER — Encounter: Payer: Self-pay | Admitting: Family Medicine

## 2023-10-13 LAB — CYTOLOGY - PAP
Adequacy: ABSENT
Chlamydia: NEGATIVE
Comment: NEGATIVE
Comment: NEGATIVE
Comment: NEGATIVE
Comment: NORMAL
Diagnosis: NEGATIVE
High risk HPV: NEGATIVE
Neisseria Gonorrhea: NEGATIVE
Trichomonas: NEGATIVE

## 2023-10-13 NOTE — Progress Notes (Signed)
 Cholesterol borderline elevated but better than last year.  The rest of her labs are all normal.  Do not need to make any changes to her treatment plan.  She should continue to work on diet and exercise and we can recheck this again in a year.  Her Pap smear was normal.  We can repeat this in 5 years.

## 2023-12-05 IMAGING — MG DIGITAL DIAGNOSTIC BILAT W/ TOMO W/ CAD
8 of 14 series · 8 of 34 positions shown · non-contrast
Comparison: Previous exam(s).

CLINICAL DATA: 50-year-old female presenting for annual follow-up
of probably benign right breast calcifications and for annual exam
of the left breast.

EXAM:
DIGITAL DIAGNOSTIC BILATERAL MAMMOGRAM WITH TOMOSYNTHESIS AND CAD
TECHNIQUE: Bilateral digital diagnostic mammography and breast tomosynthesis
was performed. The images were evaluated with computer-aided
detection.

[R ML (1 of 2)]
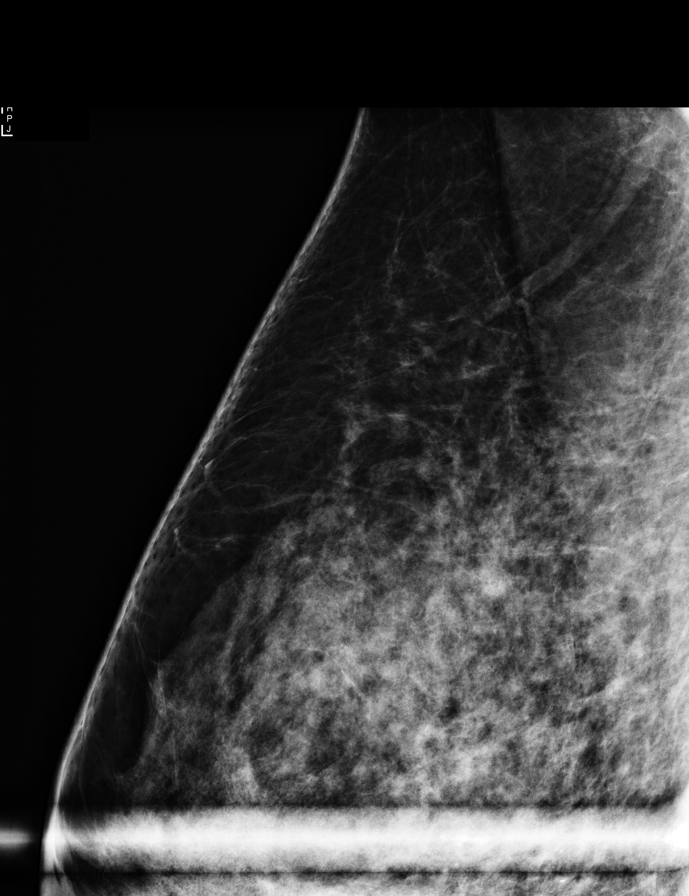

[R ML (2 of 2)]
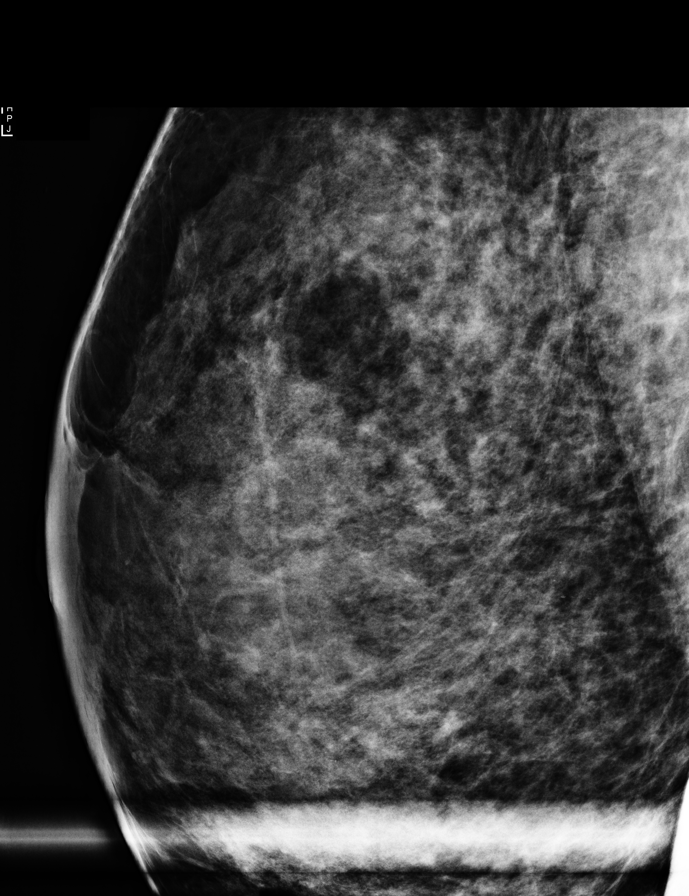

[R CC (1 of 2)]
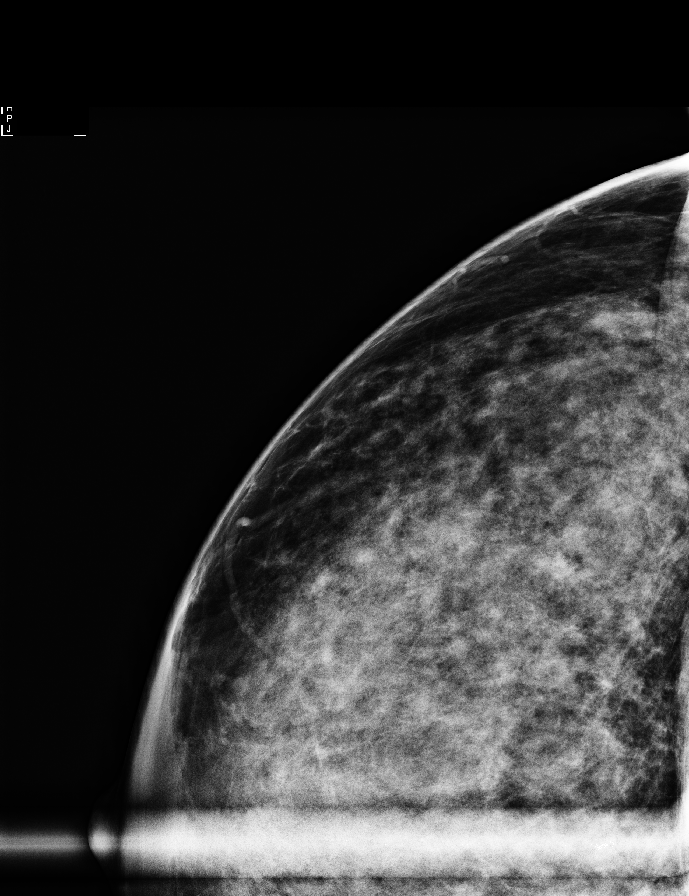

[R CC (2 of 2)]
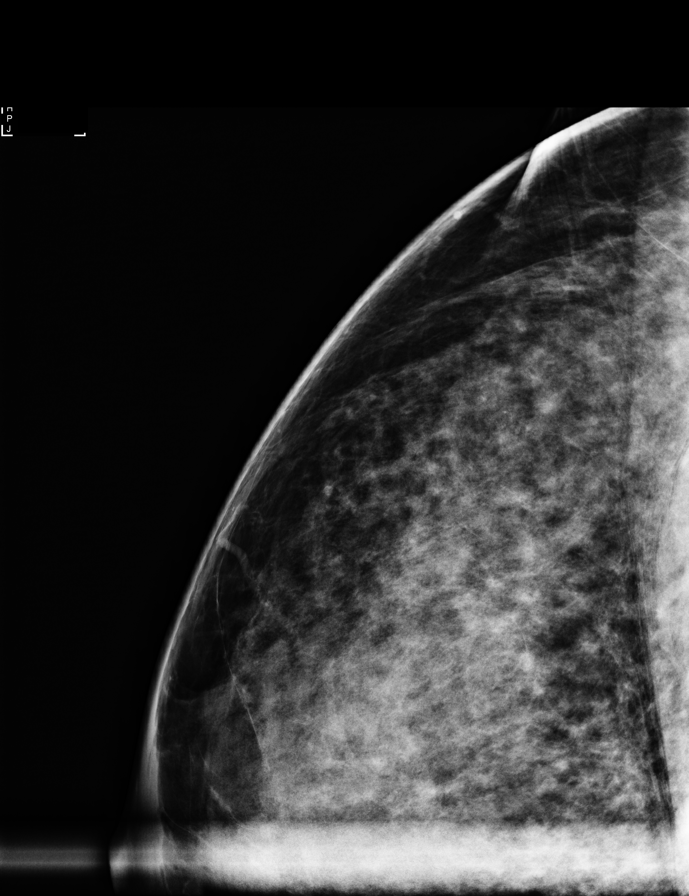

[L CC synth-2D]
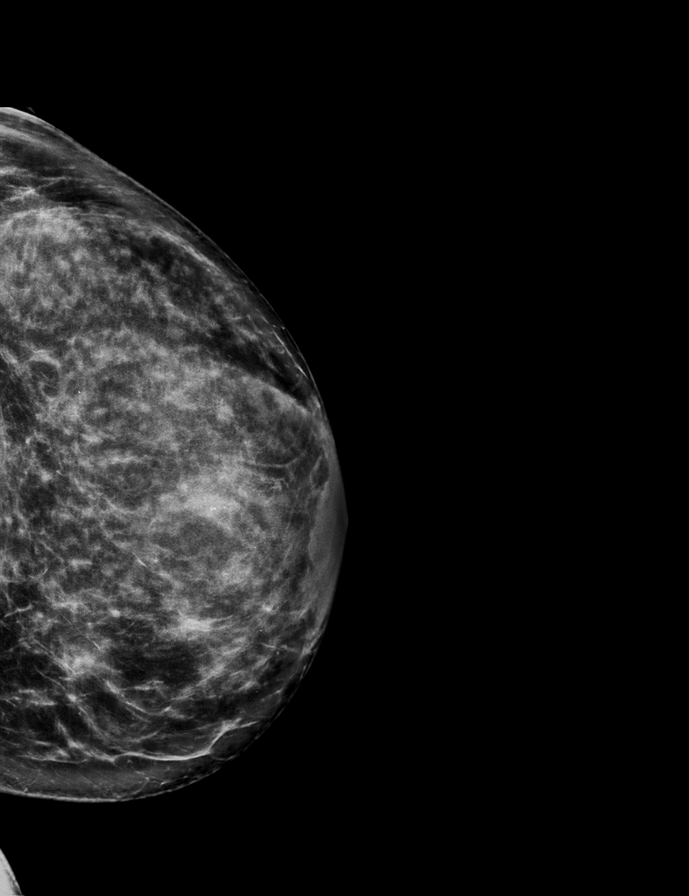

[R XCCL synth-2D]
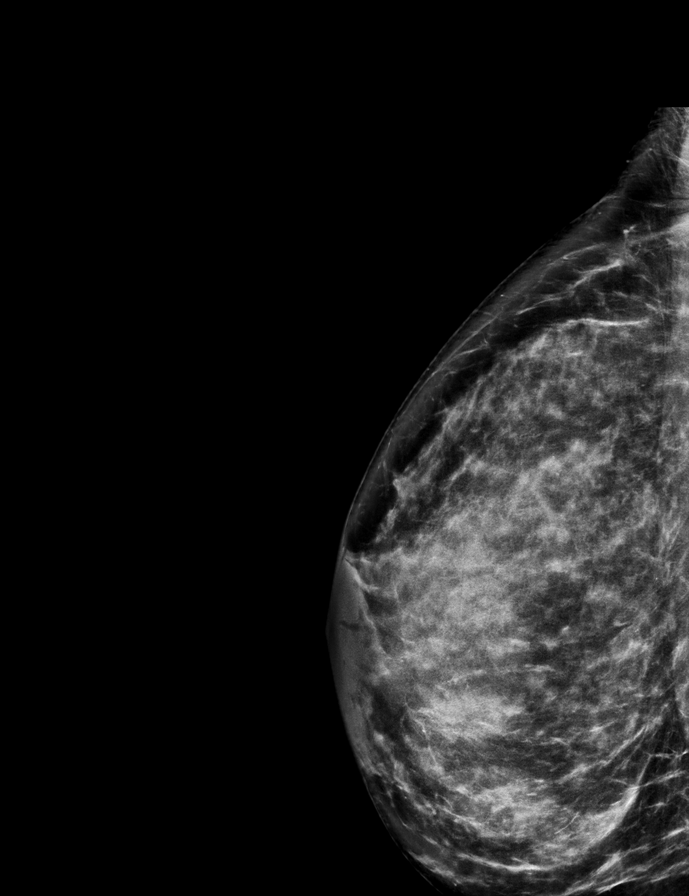

[L MLO synth-2D]
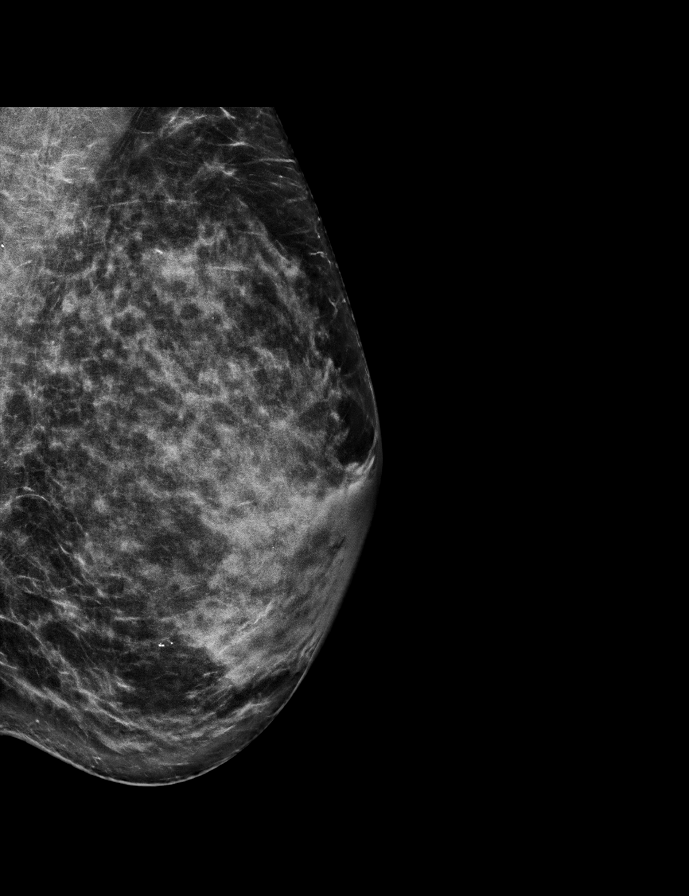

[R MLO synth-2D]
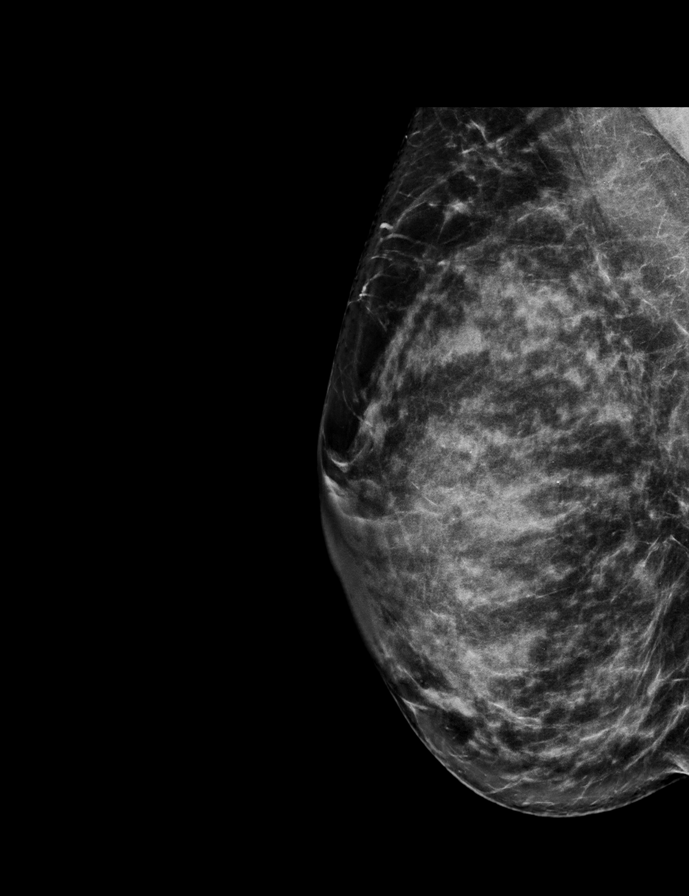

[8 of 34 positions shown; findings below may reference images not displayed]

ACR Breast Density Category c: The breast tissue is heterogeneously
dense, which may obscure small masses.
FINDINGS: Right breast: Spot 2D magnification views of the right breast were
performed in addition to standard views. There are stable grouped
calcifications in the lateral posterior right breast best seen on
the true lateral magnification views. Some of these appear to layer.
There is a new tiny group of approximately 3 punctate calcifications
in the axillary tail seen only on the full field MLO view which do
not definitely persist on the magnification views.

Left breast: No suspicious mass, distortion, or microcalcifications
are identified to suggest presence of malignancy.
IMPRESSION: 1. Stable probably benign calcifications in the lateral right
breast. These of been stable for 1 year.

2. New tiny group of approximately 3 punctate calcifications in the
right axillary tail, which are also probably benign.

3.  No mammographic evidence of malignancy in the left breast.

RECOMMENDATION:
Diagnostic right breast mammogram in 6 months starting with standard
full field views of the right breast.

I have discussed the findings and recommendations with the patient
who agrees to short-term follow-up. If applicable, a reminder letter
will be sent to the patient regarding the next appointment.

BI-RADS CATEGORY  3: Probably benign.

## 2023-12-28 ENCOUNTER — Other Ambulatory Visit: Payer: Self-pay | Admitting: Family Medicine

## 2023-12-28 DIAGNOSIS — R921 Mammographic calcification found on diagnostic imaging of breast: Secondary | ICD-10-CM

## 2024-01-15 ENCOUNTER — Telehealth: Payer: Self-pay | Admitting: *Deleted

## 2024-01-15 NOTE — Telephone Encounter (Signed)
 Copied from CRM [id number]. Topic: Appointments - Scheduling Inquiry for Clinic >> Jan 15, 2024 10:38 AM Adonis Hoot wrote: Reason for CRM: Patient would like to know if Dr Daneil Dunker would take on her husband as a patient of his ?  Please advise  Deagan Sevin,RMA

## 2024-01-15 NOTE — Telephone Encounter (Signed)
 That is fine.  Sarah Murphy. Daneil Dunker, MD 01/15/2024 12:50 PM

## 2024-01-15 NOTE — Telephone Encounter (Signed)
 LVM Dr Daneil Dunker ok TOC please give us  a call to schedule an office visit

## 2024-01-21 ENCOUNTER — Other Ambulatory Visit: Payer: Self-pay | Admitting: Family Medicine

## 2024-01-21 MED ORDER — LOW-OGESTREL 0.3-30 MG-MCG PO TABS: ORAL_TABLET | ORAL | 3 refills | Status: DC

## 2024-01-21 NOTE — Telephone Encounter (Signed)
 Copied from CRM [id number]. Topic: Clinical - Medication Refill >> Jan 21, 2024 10:28 AM Dorisann Garre T wrote: Medication: LOW-OGESTREL  0.3-30 MG-MCG tablet [[id number]]   [facility]   Has the patient contacted their pharmacy? Yes (Agent: If no, request that the patient contact the pharmacy for the refill. If patient does not wish to contact the pharmacy document the reason why and proceed with request.) (Agent: If yes, when and what did the pharmacy advise?)  This is the patient's preferred pharmacy:  [id number] [place], [place] - [address] [place] [address] Phone: [phone] Fax: [phone]   Is this the correct pharmacy for this prescription? Yes If no, delete pharmacy and type the correct one.   Has the prescription been filled recently? No  Is the patient out of the medication? Yes  Has the patient been seen for an appointment in the last year OR does the patient have an upcoming appointment? Yes  Can we respond through MyChart? Yes  Agent: Please be advised that Rx refills may take up to 3 business days. We ask that you follow-up with your pharmacy.

## 2024-02-01 ENCOUNTER — Telehealth: Payer: Self-pay | Admitting: *Deleted

## 2024-02-01 NOTE — Telephone Encounter (Signed)
 Copied from CRM [id number]. Topic: Clinical - Medication Question >> Feb 01, 2024  1:20 PM Franky GRADE wrote: Reason for CRM: Patient's pharmacy is calling to advise that LOW-OGESTREL  0.3-30 MG-MCG tablet [[id number]] is no longer in production and an alternative medication would have to be sent.

## 2024-02-02 ENCOUNTER — Telehealth: Payer: Self-pay

## 2024-02-02 MED ORDER — NORETHINDRONE ACET-ETHINYL EST 1.5-30 MG-MCG PO TABS: 1.0000 | ORAL_TABLET | Freq: Every day | ORAL | 11 refills | Status: AC

## 2024-02-02 NOTE — Telephone Encounter (Signed)
 Copied from CRM [id number]. Topic: Clinical - Medication Question >> Feb 01, 2024  1:20 PM Franky GRADE wrote: Reason for CRM: Patient's pharmacy is calling to advise that LOW-OGESTREL  0.3-30 MG-MCG tablet [[id number]] is no longer in production and an alternative medication would have to be sent. >> Feb 02, 2024 10:50 AM [place] J wrote: Patient flies out of the [place] Thursday morning and is needing a replacement for this medication as soon as possible.   [id number] [place], [place] - [address]  [address] [place] [address]  Phone: [phone] Fax: [phone]  Hours: Not open 24 hours

## 2024-02-09 ENCOUNTER — Ambulatory Visit: Payer: Self-pay

## 2024-02-09 NOTE — Telephone Encounter (Signed)
 Copied from CRM [id number]. Topic: Clinical - Red Word Triage >> Feb 09, 2024  4:14 PM Elle L wrote: Red Word that prompted transfer to Nurse Triage: The patient is in [facility] and will be flying home on Thursday. The patient had a cold last week that has went to her lungs and she has a productive cough. The patient now has pink eye and is requesting to see if she delays care until she returns home if it will it be detrimental to her eyesight. Reason for Disposition  [1] Only 1 eye is red AND [2] present > 48 hours  [1] Very small amount of discharge AND [2] only in corner of eye  Answer Assessment - Initial Assessment Questions 1. EYE DISCHARGE: Is the discharge in one or both eyes? What color is it? How much is there? When did the discharge start?      ---- Creamy white, in the corners of her eye. Only once, when waking up.   2. REDNESS OF SCLERA: Is the redness in one or both eyes? When did the redness start?      ------ Yes    3. EYELIDS: Are the eyelids red or swollen? If Yes, ask: How much?      ---Denies   4. VISION: Is there any difficulty seeing clearly?      --------------  Denies    5. PAIN: Is there any pain? If Yes, ask: How bad is it? (Scale 1-10; or mild, moderate, severe)    - MILD (1-3): doesn't interfere with normal activities     - MODERATE (4-7): interferes with normal activities or awakens from sleep    - SEVERE (8-10): excruciating pain, unable to do any normal activities       -----------------1/10    6. CONTACT LENS: Do you wear contacts?     -----Denies    7. OTHER SYMPTOMS: Do you have any other symptoms? (e.g., fever, runny nose, cough)     ---------------------------- Cough: Not productive.  --------------------------------- Sore Throat     Additional Information: Patient is currently in [place], returns Thursday 07/03 Patient reports husband had a cold and then pink eye.  Patient thinks she has the same.  Onset of Sore  throat and cough initiated first ( 06/25) and then eye symptoms started.    Patient is currently using  artificial tear drops and finds relief from them.    Home Care advise given.  Patient educated on pertinent s/s that would warrant emergent help/call 911/ ED/UC.  Patient verbalized understanding and agrees with plan No additional questions/concerns noted during the time of the call.  Protocols used: Eye - Pus or Discharge-A-AH, Eye - Red Without Pus-A-AH

## 2024-02-10 NOTE — Telephone Encounter (Signed)
 Noted

## 2024-02-11 ENCOUNTER — Other Ambulatory Visit: Payer: Self-pay | Admitting: Medical Genetics

## 2024-02-17 ENCOUNTER — Ambulatory Visit
Admission: RE | Admit: 2024-02-17 | Discharge: 2024-02-17 | Disposition: A | Discharge status: Home / Self Care | Source: Ambulatory Visit | Attending: Family Medicine | Admitting: Family Medicine

## 2024-02-17 DIAGNOSIS — R921 Mammographic calcification found on diagnostic imaging of breast: Secondary | ICD-10-CM

## 2024-03-03 ENCOUNTER — Other Ambulatory Visit

## 2024-03-03 DIAGNOSIS — Z006 Encounter for examination for normal comparison and control in clinical research program: Secondary | ICD-10-CM

## 2024-03-19 LAB — GENECONNECT MOLECULAR SCREEN: Genetic Analysis Overall Interpretation: NEGATIVE
